# Patient Record
Sex: Female | Born: 1935 | Race: White | Hispanic: No | Smoking: Never smoker
Health system: Southern US, Community
[De-identification: ages and names within clinical notes are randomized; demographics above are authoritative.]

## PROBLEM LIST (undated history)

## (undated) DIAGNOSIS — I10 Essential (primary) hypertension: Secondary | ICD-10-CM

## (undated) DIAGNOSIS — Z66 Do not resuscitate: Secondary | ICD-10-CM

## (undated) DIAGNOSIS — N183 Chronic kidney disease, stage 3 unspecified: Secondary | ICD-10-CM

## (undated) DIAGNOSIS — M810 Age-related osteoporosis without current pathological fracture: Secondary | ICD-10-CM

## (undated) DIAGNOSIS — K219 Gastro-esophageal reflux disease without esophagitis: Secondary | ICD-10-CM

## (undated) DIAGNOSIS — F0390 Unspecified dementia without behavioral disturbance: Secondary | ICD-10-CM

## (undated) DIAGNOSIS — G309 Alzheimer's disease, unspecified: Secondary | ICD-10-CM

## (undated) DIAGNOSIS — F0280 Dementia in other diseases classified elsewhere without behavioral disturbance: Secondary | ICD-10-CM

## (undated) DIAGNOSIS — E785 Hyperlipidemia, unspecified: Secondary | ICD-10-CM

## (undated) HISTORY — PX: HIP FRACTURE SURGERY: SHX118

---

## 2004-06-27 ENCOUNTER — Ambulatory Visit: Payer: Self-pay | Admitting: Ophthalmology

## 2004-07-25 ENCOUNTER — Ambulatory Visit: Payer: Self-pay | Admitting: Ophthalmology

## 2004-08-11 ENCOUNTER — Ambulatory Visit: Payer: Self-pay | Admitting: Family Medicine

## 2005-11-07 ENCOUNTER — Ambulatory Visit: Payer: Self-pay | Admitting: Family Medicine

## 2006-01-15 ENCOUNTER — Ambulatory Visit: Payer: Self-pay | Admitting: Urology

## 2006-11-12 ENCOUNTER — Ambulatory Visit: Payer: Self-pay | Admitting: Family Medicine

## 2007-01-07 ENCOUNTER — Ambulatory Visit: Payer: Self-pay | Admitting: Internal Medicine

## 2007-01-17 ENCOUNTER — Ambulatory Visit: Payer: Self-pay | Admitting: Internal Medicine

## 2007-02-06 ENCOUNTER — Ambulatory Visit: Payer: Self-pay | Admitting: Internal Medicine

## 2007-05-09 ENCOUNTER — Ambulatory Visit: Payer: Self-pay | Admitting: Internal Medicine

## 2007-05-17 ENCOUNTER — Ambulatory Visit: Payer: Self-pay | Admitting: Internal Medicine

## 2007-06-09 ENCOUNTER — Ambulatory Visit: Payer: Self-pay | Admitting: Internal Medicine

## 2007-07-07 ENCOUNTER — Ambulatory Visit: Payer: Self-pay | Admitting: Internal Medicine

## 2007-07-11 ENCOUNTER — Ambulatory Visit: Payer: Self-pay | Admitting: Internal Medicine

## 2007-08-07 ENCOUNTER — Ambulatory Visit: Payer: Self-pay | Admitting: Internal Medicine

## 2007-10-07 ENCOUNTER — Ambulatory Visit: Payer: Self-pay | Admitting: Internal Medicine

## 2008-01-23 ENCOUNTER — Ambulatory Visit: Payer: Self-pay | Admitting: Family Medicine

## 2009-04-23 ENCOUNTER — Ambulatory Visit: Payer: Self-pay | Admitting: Family Medicine

## 2010-04-25 ENCOUNTER — Ambulatory Visit: Payer: Self-pay | Admitting: Family Medicine

## 2010-08-26 ENCOUNTER — Ambulatory Visit: Payer: Self-pay | Admitting: Internal Medicine

## 2011-11-28 ENCOUNTER — Ambulatory Visit: Payer: Self-pay | Admitting: Family Medicine

## 2013-07-14 ENCOUNTER — Ambulatory Visit: Payer: Self-pay | Admitting: Family Medicine

## 2014-04-03 ENCOUNTER — Emergency Department: Payer: Self-pay | Admitting: Emergency Medicine

## 2014-07-31 ENCOUNTER — Inpatient Hospital Stay
Admit: 2014-07-31 | Disposition: A | Discharge status: Skilled Nursing Facility | Payer: Self-pay | Attending: Internal Medicine | Admitting: Internal Medicine

## 2014-07-31 ENCOUNTER — Ambulatory Visit
Admit: 2014-07-31 | Disposition: A | Discharge status: Home / Self Care | Payer: Self-pay | Admitting: Orthopedic Surgery

## 2014-07-31 LAB — COMPREHENSIVE METABOLIC PANEL
ALK PHOS: 91 U/L
ALT: 18 U/L
AST: 28 U/L
Albumin: 3.2 g/dL — ABNORMAL LOW
Anion Gap: 7 (ref 7–16)
BUN: 25 mg/dL — ABNORMAL HIGH
Bilirubin,Total: 0.6 mg/dL
CO2: 26 mmol/L
CREATININE: 1.51 mg/dL — AB
Calcium, Total: 8.8 mg/dL — ABNORMAL LOW
Chloride: 101 mmol/L
EGFR (African American): 37 — ABNORMAL LOW
EGFR (Non-African Amer.): 32 — ABNORMAL LOW
Glucose: 117 mg/dL — ABNORMAL HIGH
Potassium: 3.4 mmol/L — ABNORMAL LOW
Sodium: 134 mmol/L — ABNORMAL LOW
Total Protein: 7.5 g/dL

## 2014-07-31 LAB — CBC
HCT: 36.7 % (ref 35.0–47.0)
HGB: 12.1 g/dL (ref 12.0–16.0)
MCH: 26.7 pg (ref 26.0–34.0)
MCHC: 33 g/dL (ref 32.0–36.0)
MCV: 81 fL (ref 80–100)
PLATELETS: 225 10*3/uL (ref 150–440)
RBC: 4.54 10*6/uL (ref 3.80–5.20)
RDW: 14.9 % — ABNORMAL HIGH (ref 11.5–14.5)
WBC: 6.7 10*3/uL (ref 3.6–11.0)

## 2014-08-01 LAB — CBC WITH DIFFERENTIAL/PLATELET
BASOS ABS: 0 10*3/uL (ref 0.0–0.1)
BASOS PCT: 0.4 %
EOS ABS: 0.1 10*3/uL (ref 0.0–0.7)
Eosinophil %: 0.8 %
HCT: 36.1 % (ref 35.0–47.0)
HGB: 11.6 g/dL — ABNORMAL LOW (ref 12.0–16.0)
LYMPHS PCT: 11.9 %
Lymphocyte #: 0.9 10*3/uL — ABNORMAL LOW (ref 1.0–3.6)
MCH: 26.2 pg (ref 26.0–34.0)
MCHC: 32 g/dL (ref 32.0–36.0)
MCV: 82 fL (ref 80–100)
MONO ABS: 0.6 x10 3/mm (ref 0.2–0.9)
Monocyte %: 8.3 %
NEUTROS ABS: 6.1 10*3/uL (ref 1.4–6.5)
Neutrophil %: 78.6 %
Platelet: 205 10*3/uL (ref 150–440)
RBC: 4.42 10*6/uL (ref 3.80–5.20)
RDW: 15.3 % — AB (ref 11.5–14.5)
WBC: 7.7 10*3/uL (ref 3.6–11.0)

## 2014-08-01 LAB — URINALYSIS, COMPLETE
Bacteria: NONE SEEN
Bilirubin,UR: NEGATIVE
Glucose,UR: NEGATIVE mg/dL (ref 0–75)
Ketone: NEGATIVE
Nitrite: NEGATIVE
PH: 7 (ref 4.5–8.0)
Protein: 100
Specific Gravity: 1.009 (ref 1.003–1.030)
WBC UR: 11 /HPF (ref 0–5)

## 2014-08-01 LAB — APTT: Activated PTT: 26.2 secs (ref 23.6–35.9)

## 2014-08-01 LAB — BASIC METABOLIC PANEL
Anion Gap: 3 — ABNORMAL LOW (ref 7–16)
BUN: 20 mg/dL
CALCIUM: 8.4 mg/dL — AB
CHLORIDE: 104 mmol/L
CO2: 28 mmol/L
Creatinine: 1.46 mg/dL — ABNORMAL HIGH
EGFR (African American): 38 — ABNORMAL LOW
EGFR (Non-African Amer.): 33 — ABNORMAL LOW
Glucose: 109 mg/dL — ABNORMAL HIGH
POTASSIUM: 4.3 mmol/L
Sodium: 135 mmol/L

## 2014-08-01 LAB — PROTIME-INR
INR: 1
PROTHROMBIN TIME: 13.2 s

## 2014-08-02 LAB — CBC WITH DIFFERENTIAL/PLATELET
BASOS ABS: 0 10*3/uL (ref 0.0–0.1)
Basophil %: 0.4 %
Eosinophil #: 0.3 10*3/uL (ref 0.0–0.7)
Eosinophil %: 2.7 %
HCT: 33.2 % — ABNORMAL LOW (ref 35.0–47.0)
HGB: 10.6 g/dL — ABNORMAL LOW (ref 12.0–16.0)
LYMPHS ABS: 0.6 10*3/uL — AB (ref 1.0–3.6)
Lymphocyte %: 5.8 %
MCH: 26.1 pg (ref 26.0–34.0)
MCHC: 32 g/dL (ref 32.0–36.0)
MCV: 82 fL (ref 80–100)
MONOS PCT: 7.4 %
Monocyte #: 0.7 x10 3/mm (ref 0.2–0.9)
NEUTROS PCT: 83.7 %
Neutrophil #: 8 10*3/uL — ABNORMAL HIGH (ref 1.4–6.5)
PLATELETS: 169 10*3/uL (ref 150–440)
RBC: 4.07 10*6/uL (ref 3.80–5.20)
RDW: 15.3 % — ABNORMAL HIGH (ref 11.5–14.5)
WBC: 9.6 10*3/uL (ref 3.6–11.0)

## 2014-08-02 LAB — BASIC METABOLIC PANEL
ANION GAP: 7 (ref 7–16)
BUN: 17 mg/dL
CHLORIDE: 104 mmol/L
CO2: 24 mmol/L
CREATININE: 1.47 mg/dL — AB
Calcium, Total: 8.2 mg/dL — ABNORMAL LOW
GFR CALC AF AMER: 38 — AB
GFR CALC NON AF AMER: 33 — AB
GLUCOSE: 130 mg/dL — AB
POTASSIUM: 4.3 mmol/L
SODIUM: 135 mmol/L

## 2014-08-04 LAB — BASIC METABOLIC PANEL
Anion Gap: 8 (ref 7–16)
BUN: 32 mg/dL — ABNORMAL HIGH
CALCIUM: 8.3 mg/dL — AB
CO2: 25 mmol/L
Chloride: 100 mmol/L — ABNORMAL LOW
Creatinine: 1.55 mg/dL — ABNORMAL HIGH
EGFR (African American): 36 — ABNORMAL LOW
GFR CALC NON AF AMER: 31 — AB
Glucose: 139 mg/dL — ABNORMAL HIGH
POTASSIUM: 4 mmol/L
SODIUM: 133 mmol/L — AB

## 2014-08-04 LAB — CBC WITH DIFFERENTIAL/PLATELET
BASOS PCT: 0.4 %
Basophil #: 0 10*3/uL (ref 0.0–0.1)
Eosinophil #: 0.1 10*3/uL (ref 0.0–0.7)
Eosinophil %: 1.5 %
HCT: 32.4 % — AB (ref 35.0–47.0)
HGB: 10.4 g/dL — AB (ref 12.0–16.0)
LYMPHS ABS: 0.6 10*3/uL — AB (ref 1.0–3.6)
LYMPHS PCT: 7.2 %
MCH: 26.3 pg (ref 26.0–34.0)
MCHC: 32.1 g/dL (ref 32.0–36.0)
MCV: 82 fL (ref 80–100)
Monocyte #: 0.7 x10 3/mm (ref 0.2–0.9)
Monocyte %: 7.9 %
NEUTROS ABS: 7 10*3/uL — AB (ref 1.4–6.5)
Neutrophil %: 83 %
Platelet: 234 10*3/uL (ref 150–440)
RBC: 3.96 10*6/uL (ref 3.80–5.20)
RDW: 15.5 % — ABNORMAL HIGH (ref 11.5–14.5)
WBC: 8.5 10*3/uL (ref 3.6–11.0)

## 2014-08-06 ENCOUNTER — Telehealth: Payer: Self-pay

## 2014-08-06 NOTE — Telephone Encounter (Signed)
PLEASE NOTE: All timestamps contained within this report are represented as [place] Time. CONFIDENTIALTY NOTICE: This fax transmission is intended only for the addressee. It contains information that is legally privileged, confidential or otherwise protected from use or disclosure. If you are not the intended recipient, you are strictly prohibited from reviewing, disclosing, copying using or disseminating any of this information or taking any action in reliance on or regarding this information. If you have received this fax in error, please notify us immediately by telephone so that we can arrange for its return to us. Phone: [phone], Toll-Free: [phone], Fax: [phone] Page: 1 of 1 Call Id: [id number] [facility] Client TELEPHONE ADVICE RECORD [facility] Patient Name: Alicia Cohen Gender: Female DOB: 11/03/1930 Age: [age] Y 2 M 26 D Return Phone Number: Address: City/State/Zip: [place] [phone] Client [facility] Physician Tillman AbideLetvak, Richard Contact Type Call Call Type Page Only Caller Name Ann Relationship To Patient Provider Is this call to report lab results? No Return Phone Number Unavailable Initial Comment Caller states she has a patient admitted with dementia that jumped over the arm-rail and fell and hit her back and has a left femoral neck fracture. Caller is nurse [facility]. UX#[phone] Nurse Assessment Guidelines Guideline Title Affirmed Question Affirmed Notes Nurse Date/Time (Eastern Time) Disp. Time [name](Eastern Time) Disposition Final User 08/05/2014 9:34:34 PM [facility] Paging Queue Dorene SorrowChambers, [name] 08/05/2014 9:42:05 PM Called On-Call Provider [name], [name] 08/05/2014 9:43:15 PM Page Completed Yes [name], [name] After Care Instructions Given Call Event Type User Date / Time  Description Paging DoctorName DoctorPhone DateTime Result/Outcome Notes Marga MelnickHopper, William [phone] 08/05/2014 9:42:05 PM Called On Call Provider - Reached Marga MelnickHopper, William 08/05/2014 9:42:45 PM Spoke with On Call - General Reached on call and connected parties

## 2014-08-06 NOTE — Telephone Encounter (Signed)
Reviewed at [facility]

## 2014-08-07 DIAGNOSIS — S7292XA Unspecified fracture of left femur, initial encounter for closed fracture: Secondary | ICD-10-CM | POA: Diagnosis not present

## 2014-08-07 DIAGNOSIS — R410 Disorientation, unspecified: Secondary | ICD-10-CM

## 2014-08-07 DIAGNOSIS — G301 Alzheimer's disease with late onset: Secondary | ICD-10-CM | POA: Diagnosis not present

## 2014-08-07 DIAGNOSIS — I10 Essential (primary) hypertension: Secondary | ICD-10-CM

## 2014-08-07 DIAGNOSIS — K219 Gastro-esophageal reflux disease without esophagitis: Secondary | ICD-10-CM | POA: Diagnosis not present

## 2014-08-18 DIAGNOSIS — R509 Fever, unspecified: Secondary | ICD-10-CM | POA: Diagnosis not present

## 2014-08-31 LAB — SURGICAL PATHOLOGY

## 2014-09-06 NOTE — Op Note (Signed)
Patient: This 81 year old Female had a surgical procedure performed on 01-Aug-2014.  Post Operative Report:  Pre-Op Diagnosis Displaced L femoral neck fracture   Post-Op Diagnosis Same   Operation L hip hemiarthroplasty   Anesthesia Spinal   Specimen Type None   Findings As Above  displaced L femoral neck fracture   Surgeon Marjie Skiff   EBL: Minimal  883   Complications None   Description of Procedure: INDICATIONS: The patient is an 81 year old female who was brought into [facility] Emergency Room on 07/31/2014 after a slip and fall at her place of residence. She has dementia at baseline. X-rays were obtained in the Emergency Room showing a displaced femoral neck fracture of the right hip. Orthopedics was consulted. The patient was indicated for a Left hip hemiarthroplasty for both pain relief and mobilization and to prevent the complications related to prolonged bedrest. The patient walks without assistive devices at baseline and after a long discussion with the patient and her family, informed consent was obtained from her son, the POA.   DESCRIPTION OF PROCEDURE: On the day of surgery, the patient was met in the preoperative holding area. All preoperative clearances were reviewed. Anesthesia visited with the patient. The nursing staff reviewed the consent, which was verified. The left hip was marked and the patient was taken to the operating room.  The patient was brought into the operating room and initial timeout done to ensure the identity of the patient. After that, anesthesia in the form of spinal anesthesia was administered along with 2 grams of IV Ancef. The patient was then positioned in the right lateral decubitus position with his left hip facing up on a pegboard. All bony prominences were well padded. Preoperative examination showed no skin lesions at the site of surgery. The leg was cleaned with alcohol and then prepped and draped in a standard sterile fashion. A  timeout was performed and a left posterolateral approach to the left hip was done using a 12 cm incision over the posterior aspect of the greater tuberosity using a knife to incise through skin and subcutaneous fat. An elevator was used to elevate the fat off the fascia. A deep knife was used to create a poke hole through the fascia and Metzenbaum scissors were used to incise the fascia distally and proximally. The Charnley retractors were placed with care taken to avoid the sciatic nerve. The bursa was elevated using electrocautery. At this point, a Hohmann retractor was placed over the superior aspect of the fractured femoral neck to retract the gluteus medius thus revealing the piriformis tendon which was tagged with an Ethibond suture and then released with electrocautery along with the short external rotators and the quadratus femoris. Cautery of the quadratus femoris resulted in a small amount of bleeding. As the middle femoral circumflex was cauterized, the joint capsule was revealed with its disruption secondary to the fracture. The capsulotomy was completed in the shape of a T revealing the fractured femoral neck and the head of the femur sitting in the acetabulum. Using a femoral neck cutting guide, the lesser trochanter was used to reference 1 cm femoral neck cut. The set of this cut was marked, a saw was used to excise about 0.5 cm of bone. Then, a corkscrew was used to remove the femoral head. The femoral neck was then exposed using a retractor and a box osteotome was used to reveal the canal. Canal finder was used to identify the path of the femoral canal and  at this point a lateral reamer was gently entered into the canal to lateralize the approach for the prosthesis. A size 1 broach was passed with ease into the canal, then removed with rasping along the way. A size 2 broach was then passed without difficulty, which was used as a rasp on the way out. Then size 3 and 4 broaches were passed in similar  fashion with increasing difficulty. A size 5  broach was passed into the canal with caution with frequent pauses to prevent fracture. Once seated completely, it was felt to be well secured and a trial head prosthesis was placed with a zero neck and a 49m head. The hip was reduced. The length was deemed to be slightly long and the hip tight in extension, though very stable overall. The hip was dislocated gently and a -3 mm neck was trialed with nearly identical by assessing the tibial tubercles with an improvement in the hip extension tightness. The range of motion in the position of sleep and with hip and knee flexion and external rotation was deemed stable. The hip was then dislocated, and it was noticed that the stem did not loosen. The femoral canal was prepared using a bacitracin solution and irrigation, was then dried and cement was mixed. A cement centralizer was trialed and placed and a size 4 cement stopper was placed in the canal with gentle mallet taps. Once the cement was mixed using a cement gun, it was placed and then pressurized, and the size 5 stem was placed with care ensuring anteversion and lateralization to prevent varus. For 16 minutes, the stem was held in place until the cement cured. Once cured, a size 46 head a -3 neck were trialed, length and stability were deemed adequate and a 46 femoral head was placed with a -3 neck. Once the hip was reduced, it was once again assessed with stability and length deemed adequate. The hip was irrigated copiously. Three drill holes were placed in the greater trochanter. Sutures were passed and a capsular  repair and piriformis musculotendinous capsular repair was completed. The fascia was closed with 0 Vicryl. The subcuticular stitch was done with 2-0 Vicryl. The skin was closed with staples. A sterile compressive dressing was placed and a hip abduction pillow was placed. The patient was positioned in the supine position. The sponge and needle counts were  correct at the end of the case. The patient was transferred to the PACU in stable condition. One x-ray was obtained showing the prosthesis in good position with near equivalent leg lengths.   Electronic Signatures: KMarjie SkiffD (MD)  (Signed 26-Mar-16 23:02)  Authored: Patient and Date/Time, Operative Note   Last Updated: 26-Mar-16 23:02 by KPeterson Lombard(MD)

## 2014-09-06 NOTE — Discharge Summary (Signed)
Dates of Admission and Diagnosis:  Date of Admission 31-Jul-2014   Date of Discharge 05-Aug-2014   Admitting Diagnosis Left femoral neck fracture   Final Diagnosis 1. Left femoral neck fracture 2. Acute blood loss anemia  3. Acute respiratory failure 4. SInus tachycardia 5. Post-op atelectasis 6. Dementia 7. Inpatient delirium    Chief Complaint/History of Present Illness PRIMARY CARE PHYSICIAN: Rhona LeavensJames F. Burnett ShengHedrick, MD   CHIEF COMPLAINT: Leg pain.   HISTORY OF PRESENT ILLNESS: An 81 year old Caucasian female with past medical history of Alzheimer dementia as well as essential hypertension, presenting with leg pain. History is aided by family present at bedside. The patient describes having a mechanical fall, suffering pain over the left lateral portion of her hip. Described only as ???pain.??? Intensity 7/10. Currently she is pain-free; however, it was nonradiating, worse with movement, no relieving factors. During initial workup she was found to have a left femoral neck fracture.   Allergies:  ASA: GI Distress  Morphine: Rash, Itching  Pertinent Past History:  Pertinent Past History Alzheimer dementia, essential hypertension, gastroesophageal reflux disease without esophagitis.   Hospital Course:  Hospital Course 81 year old Caucasian female with a history of Alzheimer dementia as well as essential hypertension presenting with leg pain, found to have left neck fracture.   * Sinus tachycardia Improved with metoprolol  * Acute respiratory failure - resolved Likely from atelectais. Check CXR. No Pneumonia  * s/p left femoral neck fracture  POD # 4 Mild Acute blood loss anemia . No transfusion needed.  * Hypokalemia. Replaced potassium to goal of 4-5.   * Hypertension, essential. On metoprolol  * Inpatient delirium over dementia  Time spent on discharge 40 minutes   Condition on Discharge Fair   PHYSICAL EXAM ON DISCHARGE:  Physical Exam:  GEN no acute distress    HEENT pale conjunctivae   NECK supple   RESP clear BS   CARD regular rate   EXTR negative edema   PSYCH poor insight   VITAL SIGNS:  Vital Signs: **Vital Signs.:   30-Mar-16 07:29  Vital Signs Type Routine  Temperature Temperature (F) 97.8  Celsius 36.5  Temperature Source oral  Pulse Pulse 106  Respirations Respirations 18  Systolic BP Systolic BP 141  Diastolic BP (mmHg) Diastolic BP (mmHg) 81  Mean BP 101  Pulse Ox % Pulse Ox % 93  Pulse Ox Activity Level  At rest  Oxygen Delivery Room Air/ 21 %   DISCHARGE INSTRUCTIONS HOME MEDS:  Medication Reconciliation: Patient's Home Medications at Discharge:     Medication Instructions  omeprazole 20 mg oral delayed release capsule  1 cap(s) orally once a day (in the morning)   exelon 4.6 mg/24 hr transdermal film, extended release  2 patch transdermal once a day (in the morning)   multivitamin  1 tab(s) orally once a day (in the morning)   enoxaparin 30 mg/0.3 ml injectable solution  1  injectable  for 14 days then d/c and begin taking one 81 mg EC ASA per day unless contraindicated   oxycodone 5 mg oral tablet  1-2 tab(s) orally every 4 hours, As needed for severe pain   tramadol 50 mg oral tablet  1-2 tab(s) orally every 4 hours, As needed, pain for mild to moderate pain   acetaminophen 500 mg oral tablet  1 tab(s) orally every 4 hours, As needed, mild pain (1-3/10) or temp. greater than 100.4   magnesium hydroxide 8% oral suspension  30 milliliter(s) orally 2 times a day, As  needed, constipation   bisacodyl 10 mg rectal suppository  1 suppository(ies) rectal once a day, As needed, constipation   docusate-senna 50 mg-8.6 mg oral tablet  1 tab(s) orally 2 times a day   metoprolol tartrate 25 mg oral tablet  1 tab(s) orally 2 times a day    STOP TAKING THE FOLLOWING MEDICATION(S):    lisinopril 5 mg oral tablet: 1 tab(s) orally once a day  Physician's Instructions:  Dressing Care Per ortho instructions   Diet Low  Sodium   Activity Limitations As tolerated  Per ortho instructions   Return to Work Not Applicable   Time frame for Follow Up Appointment 1-2 weeks  Orthopedics   Electronic Signatures: Jonathan Kirkendoll, Andreas Blower (MD)  (Signed 30-Mar-16 11:05)  Authored: ADMISSION DATE AND DIAGNOSIS, CHIEF COMPLAINT/HPI, Allergies, PERTINENT PAST HISTORY, HOSPITAL COURSE, PHYSICAL EXAM ON DISCHARGE, VITAL SIGNS, DISCHARGE INSTRUCTIONS HOME MEDS, PATIENT INSTRUCTIONS   Last Updated: 30-Mar-16 11:05 by Minette Headland (MD)

## 2014-09-06 NOTE — Consult Note (Signed)
Brief Consult Note: Comments: L hip hemiarthroplasty POD1  No acute events overnight. Patient denies complaints, pain well controlled. Her friend in the room states she did very well overnight. She denies CP/SOB.   PE: VS reviewed LLE - dressing c/d/i Motor 5/5 EHL/FHL/TA/GS SILT DP/SP/T BCR, DP 2+  Impression: s/p L hip hemiarthroplasty -WBAT with PT, hip precautions, abduction pillow -IV ancef x 24 hrs -Labs ordered to check H/H and creatinine -OOBTC -Incentive spirometry  -DVT chemoprophylaxis for 2 weeks postop -Pain control - limit narcotics  -DC Foley today if OOB -DC planning - will need SNF placement.  Electronic Signatures: Deatra JamesKahn, Evie Crumpler D (MD)  (Signed 27-Mar-16 08:57)  Authored: Brief Consult Note   Last Updated: 27-Mar-16 08:57 by Deatra JamesKahn, Halsey Hammen D (MD)

## 2014-09-06 NOTE — H&P (Signed)
PATIENT NAME:  Alicia Cohen, Alicia Cohen MR#:  [id number] DATE OF BIRTH:  02-28-1931  DATE OF ADMISSION:  07/31/2014  REFERRING PHYSICIAN: Cecille Amsterdam. Mayford Knife, MD   PRIMARY CARE PHYSICIAN: Rhona Leavens. Burnett Sheng, MD   CHIEF COMPLAINT: Leg pain.   HISTORY OF PRESENT ILLNESS: An 81 year old Caucasian female with past medical history of Alzheimer dementia as well as essential hypertension, presenting with leg pain. History is aided by family present at bedside. The patient describes having a mechanical fall, suffering pain over the left lateral portion of her hip. Described only as "pain." Intensity 7/10. Currently she is pain-free; however, it was nonradiating, worse with movement, no relieving factors. During initial workup she was found to have a left femoral neck fracture.   REVIEW OF SYSTEMS: Question the validity of some of these statements, however:  CONSTITUTIONAL: Denies fevers, chills, fatigue, weakness.  EYES: Denies blurred vision, double vision, or eye pain.  EARS, NOSE, THROAT: Denies tinnitus, ear pain, hearing loss. RESPIRATORY: Denies cough, wheeze, or shortness of breath.  CARDIOVASCULAR: Denies chest pain, palpitations, edema.  GASTROINTESTINAL: Denies nausea, vomiting, diarrhea, or abdominal pain.  GENITOURINARY: Denies dysuria or hematuria. ENDOCRINE: Denies nocturia or thyroid problems.  HEMATOLOGIC AND LYMPHATIC: Denies easy bruising or bleeding.  SKIN: Denies rash or lesions.  MUSCULOSKELETAL: Positive for pain in the left hip, as described above. Otherwise. denies pain in neck, back, shoulder, knees, or further arthritic symptoms.  NEUROLOGIC: Denies paralysis or paresthesias.  PSYCHIATRIC: Denies anxiety or depressive symptoms. Otherwise, a full review of systems performed by me is negative.   PAST MEDICAL HISTORY: Includes Alzheimer dementia, essential hypertension, gastroesophageal reflux disease without esophagitis.   SOCIAL HISTORY: No alcohol, tobacco, or drug usage.  Ambulatory without assistive devices.   FAMILY HISTORY: Denies any known cardiovascular or pulmonary disorders.   ALLERGIES: TO ASPIRIN.   HOME MEDICATIONS: Lisinopril 1 tablet daily of unknown dosage; Exelon patch 4.6 mg per 24 hours daily; Prilosec 20 mg daily; multivitamin 1 tablet p.o. daily.   PHYSICAL EXAMINATION:  VITAL SIGNS: Temperature of 98.8, heart rate of 115, respirations 18, blood pressure 162/68, saturating 96% on room air. Weight 50.3 kg, BMI of 20.3.  GENERAL: A weak, frail-appearing Caucasian female currently in no acute distress.  HEAD: Normocephalic, atraumatic.  EYES: Pupils equal, round, reactive to light. Extraocular muscles intact. No scleral icterus.  MOUTH: Moist mucous membranes. Dentition intact. No abscess noted.  EAR, NOSE, AND THROAT: Clear without exudates. No external lesions.  NECK: Supple. No thyromegaly. No nodules. No JVD.  PULMONARY: Clear to auscultation bilaterally without wheezes, rales, rhonchi. No use of accessory muscles. Good respiratory effort. CHEST: Nontender to palpation.  CARDIOVASCULAR: S1, S2, regular rate and rhythm. No murmurs, rubs, or gallops. No edema. Pedal pulses 2+ bilaterally.  GASTROINTESTINAL: Soft, nontender, nondistended. No masses. Positive bowel sounds. No hepatosplenomegaly.  MUSCULOSKELETAL: No swelling, clubbing, or edema. Range of motion limited in left lower extremity given fracture. Currently shortened, internally rotated.  NEUROLOGIC: Cranial nerves II-XII intact. No gross focal neurological deficits. Sensation intact. Reflexes intact. SKIN: No ulcerations, lesions, rash, or cyanosis. Skin warm, dry. Turgor intact.  PSYCHIATRIC: Mood and affect blunted. She is awake, alert, oriented x 3. Insight and judgment appear to be intact at this time.   LABORATORY DATA: Sodium of 134, potassium 3.4, chloride 101, bicarbonate 26, BUN 25, creatinine 1.51, glucose 117. Albumin 3.2; otherwise, LFTs within normal limits. WBC of 6.7,  hemoglobin 12.1, platelets of 225,000. Chest x-ray performed, which reveals no acute cardiopulmonary process. There is  mention of a focal nodular area in the perihilar aspect of the right lung. X-ray the left hip reveals left femoral neck fracture.   ASSESSMENT AND PLAN: An 81 year old Caucasian female with a history of Alzheimer dementia as well as essential hypertension presenting with leg pain, found to have left neck fracture.  1.  Preoperative evaluation for left femoral neck fracture METs would be considered at less than 4 given generalized dismobility. No further testing or intervention is required prior to surgery. She has no active cardiac symptoms including angina, severe valvular dysfunction, significant arrhythmias, signs of congestive heart failure. As far as medications are concerned, we will hold lisinopril perioperatively. It may be restarted postoperatively.  2.  Acute kidney injury. Intravenous fluid hydration. Follow urine output and renal function. Hold ACE inhibitor, as stated above.  3.  Hypokalemia. Replace potassium to goal of 4-5.  4.  Hypertension, essential. Hold ACE, as stated above. We will add hydralazine p.r.n.  5.  Venous thromboembolism prophylaxis. Sequential compression devices   CODE STATUS: The patient is full code.   TIME SPENT: 45 minutes.    ____________________________ Cletis Athensavid K. Hower, MD dkh:bm D: 08/01/2014 00:50:21 ET T: 08/01/2014 01:06:10 ET JOB#: [id number]  cc: Cletis Athensavid K. Hower, MD, <Dictator> DAVID Synetta ShadowK HOWER MD ELECTRONICALLY SIGNED 08/01/2014 20:40

## 2014-09-06 NOTE — Consult Note (Signed)
Brief Consult Note: Diagnosis: L femoral neck fracture.   Patient was seen by consultant.   Recommend to proceed with surgery or procedure.   Comments: Comments: HPI:  [age] F with PMH alzheimers, family friend present who provided history of fall today. Fall was witnessed, no head trauma, no LOC. No history of frequent falls. She was unable to ambulate since. Brought to the ED by ambulance, where an Xray was obtained that revealed a displaced L hip femoral neck fracture. She lives at home with her husband, has 3 adult children of which the son is the POA Horald Pollen(Robert Tukes [phone]), and ambulates without assistive devices at baseline.   PMH: Alzheimers All: ASA  PE:  LLE - Short, ER Unable to SLR pain with log roll DP2+ PT 2+ No calf pain, compartments soft 5/5 EHL/FHL/TA/GS SILT DP/SP/T BCR  Xray - L hip with displaced femoral neck fracture  Impression: L hip displaced femoral neck fracture  Plan: L hip hemiarthroplasty -NPO after midnight -Hold DVT chemoprophylaxis -Preop labs - needs UA, coags  -Foley catheter -Medical clearance -Bed rest -Pain control -informed consent was obtained, I had a long discussion with her son regarding the risks, benefits and alternatives to hemiarthroplasty of her left hip.He verbalized understanding, all questions were answered.  Electronic Signatures: Deatra JamesKahn, Shawntee Mainwaring D (MD)  (Signed 26-Mar-16 00:29)  Authored: Brief Consult Note   Last Updated: 26-Mar-16 00:29 by Deatra JamesKahn, Breane Grunwald D (MD)

## 2014-09-06 NOTE — Consult Note (Signed)
Brief Consult Note: Diagnosis: L femoral neck fracture.   Comments: Postop check L hip hemiarthroplasty  Patient reports doing well, says her son visited her. Denies pain. Friend in room states she is doing well. She denies CP/SOB.   PE: VS reviewed LLE - dressing c/d/i Motor 5/5 EHL/FHL/TA/GS SILT DP/SP/T BCR, DP 2+  Impression: s/p L hip hemiarthroplasty -postop xray reviewed, hardware well aligned -WBAT with PT, hip precautions, abduction pillow -IV ancef x 24 hrs -OOBTC -Incentive spirometry  -DVT chemoprophylaxis for 2 weeks postop -Pain control - limit narcotics  -DC Foley Sunday once OOB -DC planning - will need SNF placement.  Electronic Signatures: Andee PolesKahn, Kasiah Manka D (MD)  (Signed 26-Mar-16 22:27)  Authored: Brief Consult Note   Last Updated: 26-Mar-16 22:27 by Deatra JamesKahn, Shemiah Rosch D (MD)

## 2014-09-06 NOTE — Consult Note (Signed)
Brief Consult Note: Diagnosis: L femoral neck fracture.   Comments: Patient denies complaints, nursing reports no events overnight. Family friend in room states patient did very well over night. Patient pleasant this am  NAD, AAOx1 vitals reviewed LLE - shortened, ER pain with log roll 5/5 EHL/FHL/TA/gS SILT DP/SP/T BCR  Impression: L hip femoral neck fracture Plan For OR today Medical notes reviewed AM labs reviewed Marked for OR Continue NPO, hold chemoprophylaxis Ancef ordered and held for OR.  Electronic Signatures: Andee PolesKahn, Caroleena Paolini D (MD)  (Signed 26-Mar-16 08:25)  Authored: Brief Consult Note   Last Updated: 26-Mar-16 08:25 by Deatra JamesKahn, Maycol Hoying D (MD)

## 2014-10-01 ENCOUNTER — Other Ambulatory Visit: Payer: Self-pay | Admitting: Internal Medicine

## 2014-11-05 ENCOUNTER — Encounter: Payer: Self-pay | Admitting: *Deleted

## 2014-11-05 ENCOUNTER — Emergency Department
Admission: EM | Admit: 2014-11-05 | Discharge: 2014-11-05 | Discharge status: Against Medical Advice | Payer: Medicare Other | Attending: Emergency Medicine | Admitting: Emergency Medicine

## 2014-11-05 DIAGNOSIS — Y9289 Other specified places as the place of occurrence of the external cause: Secondary | ICD-10-CM | POA: Insufficient documentation

## 2014-11-05 DIAGNOSIS — W1839XA Other fall on same level, initial encounter: Secondary | ICD-10-CM | POA: Insufficient documentation

## 2014-11-05 DIAGNOSIS — Y9389 Activity, other specified: Secondary | ICD-10-CM | POA: Insufficient documentation

## 2014-11-05 DIAGNOSIS — Y998 Other external cause status: Secondary | ICD-10-CM | POA: Diagnosis not present

## 2014-11-05 DIAGNOSIS — W19XXXA Unspecified fall, initial encounter: Secondary | ICD-10-CM

## 2014-11-05 DIAGNOSIS — F0390 Unspecified dementia without behavioral disturbance: Secondary | ICD-10-CM | POA: Insufficient documentation

## 2014-11-05 DIAGNOSIS — Z043 Encounter for examination and observation following other accident: Secondary | ICD-10-CM | POA: Insufficient documentation

## 2014-11-05 NOTE — ED Provider Notes (Signed)
[facility] Emergency Department Provider Note    ____________________________________________  Time seen: 1815  I have reviewed the triage vital signs and the nursing notes.   HISTORY  Chief Complaint Fall   History limited by: Dementia   HPI Alicia Cohen is a 81 y.o. female who presents to the emergency department today because of a fall. This occurred at 4:30 this morning. She went outside and then was found down between 2 cars. She does not recall how she fell. She does not think she hit her head. She is currently not complaining of any pain. Per caregiver she is acting at her baseline. EMS was called out initially and did not see any initial acute injuries. Patient is not on any blood thinners.   History reviewed. No pertinent past medical history.  There are no active problems to display for this patient.   Past Surgical History  Procedure Laterality Date  . Hip fracture surgery      No current outpatient prescriptions on file.  Allergies Review of patient's allergies indicates no known allergies.  No family history on file.  Social History History  Substance Use Topics  . Smoking status: Never Smoker   . Smokeless tobacco: Not on file  . Alcohol Use: No    Review of Systems  Constitutional: Negative for fever. Cardiovascular: Negative for chest pain. Respiratory: Negative for shortness of breath. Gastrointestinal: Negative for abdominal pain, vomiting and diarrhea. Musculoskeletal: Negative for back pain. Skin: Negative for rash. Neurological: Negative for headaches, focal weakness or numbness.   10-point ROS otherwise negative.  ____________________________________________   PHYSICAL EXAM:  VITAL SIGNS: ED Triage Vitals  Enc Vitals Group     BP 11/05/14 1700 146/77 mmHg     Pulse Rate 11/05/14 1700 114     Resp --      Temp 11/05/14 1700 97.6 F (36.4 C)     Temp Source 11/05/14 1700 Oral     SpO2 11/05/14  1700 92 %     Weight 11/05/14 1700 100 lb (45.36 kg)     Height 11/05/14 1700 5' (1.524 m)   Constitutional: Alert and oriented. Well appearing and in no distress. Eyes: Conjunctivae are normal. PERRL. Normal extraocular movements. ENT   Head: Normocephalic and atraumatic.   Nose: No congestion/rhinnorhea.   Mouth/Throat: Mucous membranes are moist.   Neck: No stridor. Hematological/Lymphatic/Immunilogical: No cervical lymphadenopathy. Cardiovascular: Normal rate, regular rhythm.  No murmurs, rubs, or gallops. Respiratory: Normal respiratory effort without tachypnea nor retractions. Breath sounds are clear and equal bilaterally. No wheezes/rales/rhonchi. Gastrointestinal: Soft and nontender. No distention. There is no CVA tenderness. Genitourinary: Deferred Musculoskeletal: Normal range of motion in all extremities. No joint effusions.  No lower extremity tenderness nor edema. Neurologic:  Normal speech and language. No gross focal neurologic deficits are appreciated. Speech is normal.  Skin:  Skin is warm, dry and intact. No rash noted. Psychiatric: Mood and affect are normal. Speech and behavior are normal. Patient exhibits appropriate insight and judgment.  ____________________________________________    LABS (pertinent positives/negatives)  None  ____________________________________________   EKG  None  ____________________________________________    RADIOLOGY  None  ____________________________________________   PROCEDURES  Procedure(s) performed: None  Critical Care performed: No  ____________________________________________   INITIAL IMPRESSION / ASSESSMENT AND PLAN / ED COURSE  Pertinent labs & imaging results that were available during my care of the patient were reviewed by me and considered in my medical decision making (see chart for details).  Patient  presents to the emergency department for evaluation after a fall. No obvious  traumatic injuries. However given the patient does have dementia and is not quite clear how the fall occurred I will get a CT of the head to rule out an intracranial bleed.  ----------------------------------------- 7:52 PM on 11/05/2014 -----------------------------------------  Appears that the patient and the caregiver left prior to the head CT being performed. They did not alert staff for me plans to leave [place] no discharge paperwork was able to be prepared.  ____________________________________________   FINAL CLINICAL IMPRESSION(S) / ED DIAGNOSES  Final diagnoses:  Fall, initial encounter   dementia   Phineas Semen, MD 11/05/14 1952

## 2014-11-05 NOTE — ED Notes (Signed)
Pt fell today , EMS examined pt on seen this morning, son is requesting pt to be checked,pt denies hitting head, pt denies any pain

## 2014-11-05 NOTE — ED Notes (Signed)
Pt here with care giver. Pt was brought in today to be checked out because she had a fall last night. Pt wondered out into the parking lot st unknown time and fell. Pt was noticed to be missing and was found around 4:30 laying between 2 cars. EMS was called to check pt out and no injuries were found. Pt's son wanted her evaluated and pt was brought in by daytime care giver. Pt stays alone at everyday from 1pm until 9am.  Care giver states that pt needs higher level of care and she should not bee living alone.  Pt has not complaints at this time.  Pt is wearing a cast on her left arm from previous fall. Pt in NAD at this time will cont to monitor pt at all times.

## 2014-11-05 NOTE — ED Notes (Signed)
Pt still not in the room, provider notified and pt was removed from the computer

## 2014-11-05 NOTE — ED Notes (Signed)
CT tech into pts room to take her for CT, pt nor her caregiver are in the room.  Will wait a few mins and check room again if pt is still not there will notify provider and d/c pt.

## 2014-11-25 ENCOUNTER — Emergency Department: Payer: Medicare Other

## 2014-11-25 ENCOUNTER — Inpatient Hospital Stay
Admission: EM | Admit: 2014-11-25 | Discharge: 2014-11-30 | DRG: 308 | Disposition: A | Discharge status: Home Health | Payer: Medicare Other | Attending: Internal Medicine | Admitting: Internal Medicine

## 2014-11-25 ENCOUNTER — Encounter: Payer: Self-pay | Admitting: Emergency Medicine

## 2014-11-25 DIAGNOSIS — I129 Hypertensive chronic kidney disease with stage 1 through stage 4 chronic kidney disease, or unspecified chronic kidney disease: Secondary | ICD-10-CM | POA: Diagnosis not present

## 2014-11-25 DIAGNOSIS — J189 Pneumonia, unspecified organism: Secondary | ICD-10-CM | POA: Diagnosis not present

## 2014-11-25 DIAGNOSIS — K219 Gastro-esophageal reflux disease without esophagitis: Secondary | ICD-10-CM | POA: Diagnosis present

## 2014-11-25 DIAGNOSIS — N184 Chronic kidney disease, stage 4 (severe): Secondary | ICD-10-CM | POA: Diagnosis not present

## 2014-11-25 DIAGNOSIS — E782 Mixed hyperlipidemia: Secondary | ICD-10-CM | POA: Diagnosis not present

## 2014-11-25 DIAGNOSIS — I471 Supraventricular tachycardia: Secondary | ICD-10-CM | POA: Diagnosis present

## 2014-11-25 DIAGNOSIS — Y939 Activity, unspecified: Secondary | ICD-10-CM | POA: Diagnosis not present

## 2014-11-25 DIAGNOSIS — I4891 Unspecified atrial fibrillation: Secondary | ICD-10-CM | POA: Diagnosis present

## 2014-11-25 DIAGNOSIS — W19XXXA Unspecified fall, initial encounter: Secondary | ICD-10-CM | POA: Diagnosis not present

## 2014-11-25 DIAGNOSIS — E46 Unspecified protein-calorie malnutrition: Secondary | ICD-10-CM | POA: Diagnosis present

## 2014-11-25 DIAGNOSIS — F0280 Dementia in other diseases classified elsewhere without behavioral disturbance: Secondary | ICD-10-CM | POA: Diagnosis present

## 2014-11-25 DIAGNOSIS — G309 Alzheimer's disease, unspecified: Secondary | ICD-10-CM | POA: Diagnosis present

## 2014-11-25 DIAGNOSIS — I214 Non-ST elevation (NSTEMI) myocardial infarction: Secondary | ICD-10-CM | POA: Diagnosis not present

## 2014-11-25 DIAGNOSIS — Z9181 History of falling: Secondary | ICD-10-CM | POA: Diagnosis not present

## 2014-11-25 DIAGNOSIS — R0902 Hypoxemia: Secondary | ICD-10-CM | POA: Diagnosis present

## 2014-11-25 DIAGNOSIS — S062X0A Diffuse traumatic brain injury without loss of consciousness, initial encounter: Secondary | ICD-10-CM | POA: Diagnosis not present

## 2014-11-25 DIAGNOSIS — G9389 Other specified disorders of brain: Secondary | ICD-10-CM

## 2014-11-25 DIAGNOSIS — M810 Age-related osteoporosis without current pathological fracture: Secondary | ICD-10-CM | POA: Diagnosis present

## 2014-11-25 DIAGNOSIS — R509 Fever, unspecified: Secondary | ICD-10-CM

## 2014-11-25 DIAGNOSIS — Y92129 Unspecified place in nursing home as the place of occurrence of the external cause: Secondary | ICD-10-CM

## 2014-11-25 DIAGNOSIS — S0011XA Contusion of right eyelid and periocular area, initial encounter: Secondary | ICD-10-CM | POA: Diagnosis present

## 2014-11-25 HISTORY — DX: Gastro-esophageal reflux disease without esophagitis: K21.9

## 2014-11-25 HISTORY — DX: Age-related osteoporosis without current pathological fracture: M81.0

## 2014-11-25 HISTORY — DX: Unspecified dementia, unspecified severity, without behavioral disturbance, psychotic disturbance, mood disturbance, and anxiety: F03.90

## 2014-11-25 HISTORY — DX: Hyperlipidemia, unspecified: E78.5

## 2014-11-25 HISTORY — DX: Essential (primary) hypertension: I10

## 2014-11-25 HISTORY — DX: Do not resuscitate: Z66

## 2014-11-25 HISTORY — DX: Alzheimer's disease, unspecified: G30.9

## 2014-11-25 HISTORY — DX: Chronic kidney disease, stage 3 unspecified: N18.30

## 2014-11-25 HISTORY — DX: Chronic kidney disease, stage 3 (moderate): N18.3

## 2014-11-25 HISTORY — DX: Dementia in other diseases classified elsewhere, unspecified severity, without behavioral disturbance, psychotic disturbance, mood disturbance, and anxiety: F02.80

## 2014-11-25 LAB — URINALYSIS COMPLETE WITH MICROSCOPIC (ARMC ONLY)
Bilirubin Urine: NEGATIVE
GLUCOSE, UA: NEGATIVE mg/dL
Ketones, ur: NEGATIVE mg/dL
LEUKOCYTES UA: NEGATIVE
NITRITE: NEGATIVE
Protein, ur: >500 mg/dL — AB
SPECIFIC GRAVITY, URINE: 1.013 (ref 1.005–1.030)
pH: 6 (ref 5.0–8.0)

## 2014-11-25 LAB — COMPREHENSIVE METABOLIC PANEL
ALK PHOS: 115 U/L (ref 38–126)
ALT: 21 U/L (ref 14–54)
ANION GAP: 10 (ref 5–15)
AST: 27 U/L (ref 15–41)
Albumin: 2.7 g/dL — ABNORMAL LOW (ref 3.5–5.0)
BUN: 30 mg/dL — ABNORMAL HIGH (ref 6–20)
CHLORIDE: 96 mmol/L — AB (ref 101–111)
CO2: 27 mmol/L (ref 22–32)
Calcium: 8.7 mg/dL — ABNORMAL LOW (ref 8.9–10.3)
Creatinine, Ser: 1.51 mg/dL — ABNORMAL HIGH (ref 0.44–1.00)
GFR calc Af Amer: 36 mL/min — ABNORMAL LOW (ref >60)
GFR, EST NON AFRICAN AMERICAN: 31 mL/min — AB (ref >60)
GLUCOSE: 120 mg/dL — AB (ref 65–99)
Potassium: 3.7 mmol/L (ref 3.5–5.1)
SODIUM: 133 mmol/L — AB (ref 135–145)
Total Bilirubin: 0.4 mg/dL (ref 0.3–1.2)
Total Protein: 7 g/dL (ref 6.5–8.1)

## 2014-11-25 LAB — MRSA PCR SCREENING: MRSA by PCR: NEGATIVE

## 2014-11-25 LAB — TROPONIN I
Troponin I: 0.14 ng/mL — ABNORMAL HIGH (ref <0.031)
Troponin I: 0.03 ng/mL (ref <0.031)

## 2014-11-25 LAB — CBC
HCT: 36.9 % (ref 35.0–47.0)
HEMOGLOBIN: 12 g/dL (ref 12.0–16.0)
MCH: 27.1 pg (ref 26.0–34.0)
MCHC: 32.7 g/dL (ref 32.0–36.0)
MCV: 82.9 fL (ref 80.0–100.0)
Platelets: 190 10*3/uL (ref 150–440)
RBC: 4.44 MIL/uL (ref 3.80–5.20)
RDW: 16.7 % — AB (ref 11.5–14.5)
WBC: 16.3 10*3/uL — ABNORMAL HIGH (ref 3.6–11.0)

## 2014-11-25 LAB — CK: Total CK: 33 U/L — ABNORMAL LOW (ref 38–234)

## 2014-11-25 LAB — TSH: TSH: 2.047 u[IU]/mL (ref 0.350–4.500)

## 2014-11-25 MED ORDER — DILTIAZEM HCL ER COATED BEADS 240 MG PO CP24
240.0000 mg | ORAL_CAPSULE | Freq: Once | ORAL | Status: AC
  Administered 2014-11-25: 240 mg via ORAL
  Filled 2014-11-25: qty 1

## 2014-11-25 MED ORDER — LORAZEPAM 2 MG/ML IJ SOLN
1.0000 mg | Freq: Once | INTRAMUSCULAR | Status: AC
  Administered 2014-11-26: 1 mg via INTRAVENOUS
  Filled 2014-11-25: qty 1

## 2014-11-25 MED ORDER — DILTIAZEM HCL 25 MG/5ML IV SOLN
5.0000 mg | Freq: Once | INTRAVENOUS | Status: AC
  Administered 2014-11-25: 5 mg via INTRAVENOUS
  Filled 2014-11-25: qty 5

## 2014-11-25 MED ORDER — CETYLPYRIDINIUM CHLORIDE 0.05 % MT LIQD
7.0000 mL | Freq: Two times a day (BID) | OROMUCOSAL | Status: DC
  Administered 2014-11-25 – 2014-11-30 (×10): 7 mL via OROMUCOSAL

## 2014-11-25 MED ORDER — ACETAMINOPHEN 650 MG RE SUPP: 650.0000 mg | Freq: Four times a day (QID) | RECTAL | Status: DC | PRN

## 2014-11-25 MED ORDER — ONDANSETRON HCL 4 MG PO TABS: 4.0000 mg | ORAL_TABLET | Freq: Four times a day (QID) | ORAL | Status: DC | PRN

## 2014-11-25 MED ORDER — MORPHINE SULFATE 2 MG/ML IJ SOLN: 1.0000 mg | INTRAMUSCULAR | Status: DC | PRN

## 2014-11-25 MED ORDER — ACETAMINOPHEN 325 MG PO TABS
650.0000 mg | ORAL_TABLET | Freq: Four times a day (QID) | ORAL | Status: DC | PRN
  Administered 2014-11-26: 650 mg via ORAL
  Filled 2014-11-25: qty 2

## 2014-11-25 MED ORDER — METOPROLOL TARTRATE 50 MG PO TABS
50.0000 mg | ORAL_TABLET | Freq: Two times a day (BID) | ORAL | Status: DC
  Administered 2014-11-25 – 2014-11-30 (×10): 50 mg via ORAL
  Filled 2014-11-25 (×11): qty 1

## 2014-11-25 MED ORDER — DILTIAZEM HCL 100 MG IV SOLR
5.0000 mg/h | INTRAVENOUS | Status: DC
  Administered 2014-11-26: 15 mg/h via INTRAVENOUS
  Filled 2014-11-25 (×2): qty 100

## 2014-11-25 MED ORDER — LISINOPRIL 5 MG PO TABS: 5.0000 mg | ORAL_TABLET | Freq: Every day | ORAL | Status: DC

## 2014-11-25 MED ORDER — RIVASTIGMINE 9.5 MG/24HR TD PT24
9.5000 mg | MEDICATED_PATCH | Freq: Every day | TRANSDERMAL | Status: DC
  Administered 2014-11-25 – 2014-11-29 (×5): 9.5 mg via TRANSDERMAL
  Filled 2014-11-25 (×6): qty 1

## 2014-11-25 MED ORDER — SODIUM CHLORIDE 0.9 % IV SOLN
INTRAVENOUS | Status: DC
  Administered 2014-11-25 – 2014-11-27 (×3): via INTRAVENOUS

## 2014-11-25 MED ORDER — PANTOPRAZOLE SODIUM 40 MG PO TBEC
40.0000 mg | DELAYED_RELEASE_TABLET | Freq: Every day | ORAL | Status: DC
  Administered 2014-11-25 – 2014-11-30 (×6): 40 mg via ORAL
  Filled 2014-11-25 (×6): qty 1

## 2014-11-25 MED ORDER — DOCUSATE SODIUM 100 MG PO CAPS
100.0000 mg | ORAL_CAPSULE | Freq: Two times a day (BID) | ORAL | Status: DC
  Administered 2014-11-25 – 2014-11-30 (×8): 100 mg via ORAL
  Filled 2014-11-25 (×8): qty 1

## 2014-11-25 MED ORDER — METOPROLOL TARTRATE 25 MG PO TABS
25.0000 mg | ORAL_TABLET | Freq: Two times a day (BID) | ORAL | Status: DC
  Administered 2014-11-25: 25 mg via ORAL
  Filled 2014-11-25: qty 1

## 2014-11-25 MED ORDER — LORATADINE 10 MG PO TABS
10.0000 mg | ORAL_TABLET | Freq: Every day | ORAL | Status: DC
  Administered 2014-11-25 – 2014-11-30 (×6): 10 mg via ORAL
  Filled 2014-11-25 (×6): qty 1

## 2014-11-25 MED ORDER — HEPARIN SODIUM (PORCINE) 5000 UNIT/ML IJ SOLN
5000.0000 [IU] | Freq: Three times a day (TID) | INTRAMUSCULAR | Status: DC
  Administered 2014-11-25 (×2): 5000 [IU] via SUBCUTANEOUS
  Filled 2014-11-25 (×2): qty 1

## 2014-11-25 MED ORDER — ONDANSETRON HCL 4 MG/2ML IJ SOLN: 4.0000 mg | Freq: Four times a day (QID) | INTRAMUSCULAR | Status: DC | PRN

## 2014-11-25 MED ORDER — ADULT MULTIVITAMIN W/MINERALS CH
1.0000 | ORAL_TABLET | Freq: Every day | ORAL | Status: DC
  Administered 2014-11-26 – 2014-11-30 (×5): 1 via ORAL
  Filled 2014-11-25 (×5): qty 1

## 2014-11-25 NOTE — ED Notes (Signed)
Pt resting quietly on stretcher in darkened exam room with eyes closed; resp even/unlab with no distress noted; noted O2 sat 90% ra while sleeping; O2 applied at 2l/min via [place]; will continue to monitor; pt denies any c/o upon awakening

## 2014-11-25 NOTE — ED Provider Notes (Signed)
[facility] Emergency Department Provider Note  ____________________________________________  Time seen: 2:10 AM  I have reviewed the triage vital signs and the nursing notes.  History and physical exam limited secondary to dementia  HISTORY  Chief Complaint Fall and Atrial Fibrillation      HPI Alicia Cohen is a 81 y.o. female presents status post unwitnessed fall at memory care unit at [facility]. History obtained from EMS. Patient with a hematoma around the right eye and forehead.     Past Medical History  Diagnosis Date  . Hyperlipidemia   . Dementia   . Alzheimer disease   . Hypertension   . GERD (gastroesophageal reflux disease)   . Osteoporosis   . Kidney disease, chronic, stage III (moderate, EGFR 30-59 ml/min)     There are no active problems to display for this patient.   Past Surgical History  Procedure Laterality Date  . Hip fracture surgery      No current outpatient prescriptions on file.  Allergies Review of patient's allergies indicates no known allergies.  History reviewed. No pertinent family history.  Social History History  Substance Use Topics  . Smoking status: Never Smoker   . Smokeless tobacco: Not on file  . Alcohol Use: No    Review of Systems  Constitutional: Negative for fever. Eyes: Negative for visual changes. ENT: Negative for sore throat. Cardiovascular: Negative for chest pain. Respiratory: Negative for shortness of breath. Gastrointestinal: Negative for abdominal pain, vomiting and diarrhea. Genitourinary: Negative for dysuria. Musculoskeletal: Negative for back pain. Skin: Positive for right eye swelling/contusion Neurological: Negative for headaches, focal weakness or numbness.   10-point ROS otherwise negative.  ____________________________________________   PHYSICAL EXAM:  VITAL SIGNS: ED Triage Vitals  Enc Vitals Group     BP 11/25/14 0208 167/78 mmHg     Pulse Rate  11/25/14 0208 125     Resp 11/25/14 0208 22     Temp 11/25/14 0208 99.6 F (37.6 C)     Temp Source 11/25/14 0208 Oral     SpO2 11/25/14 0208 95 %     Weight --      Height --      Head Cir --      Peak Flow --      Pain Score 11/25/14 0210 0     Pain Loc --      Pain Edu? --      Excl. in [place]? --      Constitutional: Alert and oriented. Well appearing and in no distress. Eyes: Conjunctivae are normal. PERRL. Normal extraocular movements. ENT   Head: Normocephalic and atraumatic.   Nose: No congestion/rhinnorhea.   Mouth/Throat: Mucous membranes are moist.   Neck: No stridor. Cardiovascular: Normal rate, regular rhythm. Normal and symmetric distal pulses are present in all extremities. No murmurs, rubs, or gallops. Respiratory: Normal respiratory effort without tachypnea nor retractions. Breath sounds are clear and equal bilaterally. No wheezes/rales/rhonchi. Gastrointestinal: Soft and nontender. No distention. There is no CVA tenderness. Genitourinary: deferred Musculoskeletal: Nontender with normal range of motion in all extremities. No joint effusions.  No lower extremity tenderness nor edema. Neurologic:  Normal speech and language. No gross focal neurologic deficits are appreciated. Speech is normal.  Skin:  Skin is warm, dry and intact. No rash noted. Psychiatric: Mood and affect are normal. Speech and behavior are normal. Patient exhibits appropriate insight and judgment.  ____________________________________________    LABS (pertinent positives/negatives)  Labs Reviewed  CBC - Abnormal; Notable for  the following:    WBC 16.3 (*)    RDW 16.7 (*)    All other components within normal limits  COMPREHENSIVE METABOLIC PANEL - Abnormal; Notable for the following:    Sodium 133 (*)    Chloride 96 (*)    Glucose, Bld 120 (*)    BUN 30 (*)    Creatinine, Ser 1.51 (*)    Calcium 8.7 (*)    Albumin 2.7 (*)    GFR calc non Af Amer 31 (*)    GFR calc Af Amer  36 (*)    All other components within normal limits  TROPONIN I - Abnormal; Notable for the following:    Troponin I 0.14 (*)    All other components within normal limits  CK - Abnormal; Notable for the following:    Total CK 33 (*)    All other components within normal limits  URINALYSIS COMPLETEWITH MICROSCOPIC ([facility] ONLY) - Abnormal; Notable for the following:    Color, Urine YELLOW (*)    APPearance CLEAR (*)    Hgb urine dipstick 1+ (*)    Protein, ur >500 (*)    Bacteria, UA RARE (*)    Squamous Epithelial / LPF 0-5 (*)    All other components within normal limits     ____________________________________________   EKG  ED ECG REPORT I, Maddilyn Campus, [place], the attending physician, personally viewed and interpreted this ECG.   Date: 11/25/2014  EKG Time: 2:05 AM  Rate: 122  Rhythm: Atrial fibrillation with rapid ventricular response  Axis: right axis deviation  Intervals: Irregular regular  ST&T Change: None   ____________________________________________    RADIOLOGY   IMPRESSION: CT HEAD:  1. No definite acute intracranial process identified. 2. Scalp contusion at the right forehead. 3. New area of apparent vasogenic edema within the anterior right frontal lobe. Further evaluation with dedicated MRI, with and without contrast is recommended. 4. Atrophy with chronic microvascular ischemic disease.  CT MAXILLOFACIAL:  1. Right forehead/periorbital contusion. 2. No other acute maxillofacial injury identified.  CT CERVICAL SPINE:  1. No CT evidence for acute traumatic injury within the cervical spine. 2. Scattered nodular densities with interlobular septal thickening within the visualized lung apices. Findings are indeterminate, but may reflect sequela of underlying infection and/or edema. Correlation with dedicated chest radiograph recommended.   Electronically Signed By: Jeannine Boga M.D. On: 11/25/2014 04:42     INITIAL  IMPRESSION / ASSESSMENT AND PLAN / ED COURSE  Pertinent labs & imaging results that were available during my care of the patient were reviewed by me and considered in my medical decision making (see chart for details).  History physical exam consistent  ____________________________________________   FINAL CLINICAL IMPRESSION(S) / ED DIAGNOSES  Final diagnoses:  Atrial fibrillation with rapid ventricular response  NSTEMI (non-ST elevated myocardial infarction)      Gregor Hams, MD 11/25/14 0502

## 2014-11-25 NOTE — Progress Notes (Signed)
PT Cancellation Note  Patient Details Name: Alicia Cohen MRN: [id number] DOB: 09/25/1930   Cancelled Treatment:    Reason Eval/Treat Not Completed: Patient not medically ready. Patient admitted after fall and noted to have vasogenic edema within R anterior frontal lobe and varying a-fib and sinus tachycardia. Per the charts patient has received Cardizem orally and twice by IV push today and continues to have resting HR of 117. Cardiology and neurology consults are pending. PT will hold on consult until patient HR is more stable.  Kerin RansomPatrick A McNamara, PT, DPT    11/25/2014, 3:46 PM

## 2014-11-25 NOTE — Progress Notes (Signed)
RN spoke with Dr. Gwen PoundsKowalski and made him aware that patient was admitted in Afib RVR and rate currently 130-140's with no drip ordered and 2 doses of IV cardizem was given in ED. Dr. Laruth BouchardKowaski stated "Give her diltiazem CD 240mg  PO once now and I will come see her this afternoon and d/c the lisinipril."

## 2014-11-25 NOTE — ED Notes (Signed)
Pt to CT via stretcher

## 2014-11-25 NOTE — Consult Note (Signed)
Reason for Consult: brain contusion Referring Physician: Dr. Johney Cohen is an 81 y.o. female.  HPI: seen at request of Dr. Volanda Cohen for brain contusion;  81 yo RHD F with end stage dementia presents to [facility] after fall.  She has a poor baseline and is dependent of all ADLs.   She is currently at her baseline per charts but there is concern due to atrial fibrillation and abnormal CT.  There were no seizures reported.  Past Medical History  Diagnosis Date  . Hyperlipidemia   . Dementia   . Alzheimer disease   . Hypertension   . GERD (gastroesophageal reflux disease)   . Osteoporosis   . Kidney disease, chronic, stage III (moderate, EGFR 30-59 ml/min)   . DNR (do not resuscitate)     Past Surgical History  Procedure Laterality Date  . Hip fracture surgery      History reviewed. No pertinent family history.  Social History:  reports that she has never smoked. She does not have any smokeless tobacco history on file. She reports that she does not drink alcohol. Her drug history is not on file.  Allergies: No Known Allergies  Medications: personally reviewed by me per chart  Results for orders placed or performed during the hospital encounter of 11/25/14 (from the past 48 hour(s))  MRSA PCR Screening     Status: None   Collection Time: 11/25/14  1:55 AM  Result Value Ref Range   MRSA by PCR NEGATIVE NEGATIVE    Comment:        The GeneXpert MRSA Assay (FDA approved for NASAL specimens only), is one component of a comprehensive MRSA colonization surveillance program. It is not intended to diagnose MRSA infection nor to guide or monitor treatment for MRSA infections.   CBC     Status: Abnormal   Collection Time: 11/25/14  2:20 AM  Result Value Ref Range   WBC 16.3 (H) 3.6 - 11.0 K/uL   RBC 4.44 3.80 - 5.20 MIL/uL   Hemoglobin 12.0 12.0 - 16.0 g/dL   HCT 36.9 35.0 - 47.0 %   MCV 82.9 80.0 - 100.0 fL   MCH 27.1 26.0 - 34.0 pg   MCHC 32.7 32.0 - 36.0 g/dL   RDW  16.7 (H) 11.5 - 14.5 %   Platelets 190 150 - 440 K/uL  Comprehensive metabolic panel     Status: Abnormal   Collection Time: 11/25/14  2:20 AM  Result Value Ref Range   Sodium 133 (L) 135 - 145 mmol/L   Potassium 3.7 3.5 - 5.1 mmol/L   Chloride 96 (L) 101 - 111 mmol/L   CO2 27 22 - 32 mmol/L   Glucose, Bld 120 (H) 65 - 99 mg/dL   BUN 30 (H) 6 - 20 mg/dL   Creatinine, Ser 1.51 (H) 0.44 - 1.00 mg/dL   Calcium 8.7 (L) 8.9 - 10.3 mg/dL   Total Protein 7.0 6.5 - 8.1 g/dL   Albumin 2.7 (L) 3.5 - 5.0 g/dL   AST 27 15 - 41 U/L   ALT 21 14 - 54 U/L   Alkaline Phosphatase 115 38 - 126 U/L   Total Bilirubin 0.4 0.3 - 1.2 mg/dL   GFR calc non Af Amer 31 (L) >60 mL/min   GFR calc Af Amer 36 (L) >60 mL/min    Comment: (NOTE) The eGFR has been calculated using the CKD EPI equation. This calculation has not been validated in all clinical situations. eGFR's persistently <60 mL/min  signify possible Chronic Kidney Disease.    Anion gap 10 5 - 15  Troponin I     Status: Abnormal   Collection Time: 11/25/14  2:20 AM  Result Value Ref Range   Troponin I 0.14 (H) <0.031 ng/mL    Comment: READ BACK AND VERIFIED WITH ALECIA Cohen @ 7782 7.20.16 MPG        PERSISTENTLY INCREASED TROPONIN VALUES IN THE RANGE OF 0.04-0.49 ng/mL CAN BE SEEN IN:       -UNSTABLE ANGINA       -CONGESTIVE HEART FAILURE       -MYOCARDITIS       -CHEST TRAUMA       -ARRYHTHMIAS       -LATE PRESENTING MYOCARDIAL INFARCTION       -COPD   CLINICAL FOLLOW-UP RECOMMENDED.   CK     Status: Abnormal   Collection Time: 11/25/14  2:20 AM  Result Value Ref Range   Total CK 33 (L) 38 - 234 U/L  TSH     Status: None   Collection Time: 11/25/14  2:20 AM  Result Value Ref Range   TSH 2.047 0.350 - 4.500 uIU/mL  Urinalysis complete, with microscopic ([facility] only)     Status: Abnormal   Collection Time: 11/25/14  3:43 AM  Result Value Ref Range   Color, Urine YELLOW (A) YELLOW   APPearance CLEAR (A) CLEAR   Glucose, UA  NEGATIVE NEGATIVE mg/dL   Bilirubin Urine NEGATIVE NEGATIVE   Ketones, ur NEGATIVE NEGATIVE mg/dL   Specific Gravity, Urine 1.013 1.005 - 1.030   Hgb urine dipstick 1+ (A) NEGATIVE   pH 6.0 5.0 - 8.0   Protein, ur >500 (A) NEGATIVE mg/dL   Nitrite NEGATIVE NEGATIVE   Leukocytes, UA NEGATIVE NEGATIVE   RBC / HPF 6-30 0 - 5 RBC/hpf   WBC, UA 0-5 0 - 5 WBC/hpf   Bacteria, UA RARE (A) NONE SEEN   Squamous Epithelial / LPF 0-5 (A) NONE SEEN   Mucous PRESENT    Hyaline Casts, UA PRESENT    Amorphous Crystal PRESENT   Troponin I     Status: None   Collection Time: 11/25/14  8:44 AM  Result Value Ref Range   Troponin I 0.03 <0.031 ng/mL    Comment:        NO INDICATION OF MYOCARDIAL INJURY.     Ct Head Wo Contrast  11/25/2014   CLINICAL DATA:  Initial evaluation for acute trauma.  EXAM: CT HEAD WITHOUT CONTRAST  CT MAXILLOFACIAL WITHOUT CONTRAST  CT CERVICAL SPINE WITHOUT CONTRAST  TECHNIQUE: Multidetector CT imaging of the head, cervical spine, and maxillofacial structures were performed using the standard protocol without intravenous contrast. Multiplanar CT image reconstructions of the cervical spine and maxillofacial structures were also generated.  COMPARISON:  None.  FINDINGS: CT HEAD FINDINGS  Diffuse prominence of the CSF containing spaces is compatible with generalized cerebral atrophy. Patchy and confluent hypodensity within the periventricular and deep white matter is most consistent with chronic small vessel ischemic disease.  There is new hypodensity involving predominantly the deep white matter of the anterior right frontal lobe, suspicious for possible vasogenic edema (series 2, image 14). No frank mass lesion identified. No other areas of vasogenic edema.  No acute large vessel territory infarct. No intracranial hemorrhage. No extra-axial fluid collection.  No midline shift identified. Ventricular prominence related to global parenchymal volume loss is similar to previous. No  hydrocephalus.  Scalp contusion present at the right forehead.  Scalp soft tissues are otherwise unremarkable.  Calvarium intact. Paranasal sinuses are clear. No mastoid effusion.  CT MAXILLOFACIAL FINDINGS  Right forehead/ periorbital contusion. Intact globes. No retro-orbital pathology. Sequelae of prior lens extraction noted bilaterally. Bony orbits intact without evidence of orbital floor fracture. Lamina perforation intact. Orbital roofs intact.  Zygomatic arch is intact. No maxillary fracture. Pterygoid plates intact. Nasal bones intact. Nasal septum bowed to the left but intact.  Mandible intact. Mandibular condyles normally situated within the temporomandibular fossa.  Paranasal sinuses are well pneumatized and free of fluid.  CT CERVICAL SPINE FINDINGS  The vertebral bodies are normally aligned with preservation of the normal cervical lordosis. Vertebral body heights are preserved. Normal C1-2 articulations are intact. No prevertebral soft tissue swelling. No acute fracture or listhesis. Moderate degenerative disc disease present at C4-5 and C5-6.  Visualized soft tissues of the neck demonstrate no acute abnormality. Interlobular septal thickening with multi focal nodular densities seen within the visualized lungs, indeterminate. Irregular biapical pleural thickening noted.  IMPRESSION: CT HEAD:  1. No definite acute intracranial process identified. 2. Scalp contusion at the right forehead. 3. New area of apparent vasogenic edema within the anterior right frontal lobe. Further evaluation with dedicated MRI, with and without contrast is recommended. 4. Atrophy with chronic microvascular ischemic disease.  CT MAXILLOFACIAL:  1. Right forehead/periorbital contusion. 2. No other acute maxillofacial injury identified.  CT CERVICAL SPINE:  1. No CT evidence for acute traumatic injury within the cervical spine. 2. Scattered nodular densities with interlobular septal thickening within the visualized lung apices.  Findings are indeterminate, but may reflect sequela of underlying infection and/or edema. Correlation with dedicated chest radiograph recommended.   Electronically Signed   By: Jeannine Boga M.D.   On: 11/25/2014 04:42   Ct Cervical Spine Wo Contrast  11/25/2014   CLINICAL DATA:  Initial evaluation for acute trauma.  EXAM: CT HEAD WITHOUT CONTRAST  CT MAXILLOFACIAL WITHOUT CONTRAST  CT CERVICAL SPINE WITHOUT CONTRAST  TECHNIQUE: Multidetector CT imaging of the head, cervical spine, and maxillofacial structures were performed using the standard protocol without intravenous contrast. Multiplanar CT image reconstructions of the cervical spine and maxillofacial structures were also generated.  COMPARISON:  None.  FINDINGS: CT HEAD FINDINGS  Diffuse prominence of the CSF containing spaces is compatible with generalized cerebral atrophy. Patchy and confluent hypodensity within the periventricular and deep white matter is most consistent with chronic small vessel ischemic disease.  There is new hypodensity involving predominantly the deep white matter of the anterior right frontal lobe, suspicious for possible vasogenic edema (series 2, image 14). No frank mass lesion identified. No other areas of vasogenic edema.  No acute large vessel territory infarct. No intracranial hemorrhage. No extra-axial fluid collection.  No midline shift identified. Ventricular prominence related to global parenchymal volume loss is similar to previous. No hydrocephalus.  Scalp contusion present at the right forehead. Scalp soft tissues are otherwise unremarkable.  Calvarium intact. Paranasal sinuses are clear. No mastoid effusion.  CT MAXILLOFACIAL FINDINGS  Right forehead/ periorbital contusion. Intact globes. No retro-orbital pathology. Sequelae of prior lens extraction noted bilaterally. Bony orbits intact without evidence of orbital floor fracture. Lamina perforation intact. Orbital roofs intact.  Zygomatic arch is intact. No  maxillary fracture. Pterygoid plates intact. Nasal bones intact. Nasal septum bowed to the left but intact.  Mandible intact. Mandibular condyles normally situated within the temporomandibular fossa.  Paranasal sinuses are well pneumatized and free of fluid.  CT CERVICAL SPINE FINDINGS  The vertebral  bodies are normally aligned with preservation of the normal cervical lordosis. Vertebral body heights are preserved. Normal C1-2 articulations are intact. No prevertebral soft tissue swelling. No acute fracture or listhesis. Moderate degenerative disc disease present at C4-5 and C5-6.  Visualized soft tissues of the neck demonstrate no acute abnormality. Interlobular septal thickening with multi focal nodular densities seen within the visualized lungs, indeterminate. Irregular biapical pleural thickening noted.  IMPRESSION: CT HEAD:  1. No definite acute intracranial process identified. 2. Scalp contusion at the right forehead. 3. New area of apparent vasogenic edema within the anterior right frontal lobe. Further evaluation with dedicated MRI, with and without contrast is recommended. 4. Atrophy with chronic microvascular ischemic disease.  CT MAXILLOFACIAL:  1. Right forehead/periorbital contusion. 2. No other acute maxillofacial injury identified.  CT CERVICAL SPINE:  1. No CT evidence for acute traumatic injury within the cervical spine. 2. Scattered nodular densities with interlobular septal thickening within the visualized lung apices. Findings are indeterminate, but may reflect sequela of underlying infection and/or edema. Correlation with dedicated chest radiograph recommended.   Electronically Signed   By: Jeannine Boga M.D.   On: 11/25/2014 04:42   Ct Maxillofacial Wo Cm  11/25/2014   CLINICAL DATA:  Initial evaluation for acute trauma.  EXAM: CT HEAD WITHOUT CONTRAST  CT MAXILLOFACIAL WITHOUT CONTRAST  CT CERVICAL SPINE WITHOUT CONTRAST  TECHNIQUE: Multidetector CT imaging of the head, cervical  spine, and maxillofacial structures were performed using the standard protocol without intravenous contrast. Multiplanar CT image reconstructions of the cervical spine and maxillofacial structures were also generated.  COMPARISON:  None.  FINDINGS: CT HEAD FINDINGS  Diffuse prominence of the CSF containing spaces is compatible with generalized cerebral atrophy. Patchy and confluent hypodensity within the periventricular and deep white matter is most consistent with chronic small vessel ischemic disease.  There is new hypodensity involving predominantly the deep white matter of the anterior right frontal lobe, suspicious for possible vasogenic edema (series 2, image 14). No frank mass lesion identified. No other areas of vasogenic edema.  No acute large vessel territory infarct. No intracranial hemorrhage. No extra-axial fluid collection.  No midline shift identified. Ventricular prominence related to global parenchymal volume loss is similar to previous. No hydrocephalus.  Scalp contusion present at the right forehead. Scalp soft tissues are otherwise unremarkable.  Calvarium intact. Paranasal sinuses are clear. No mastoid effusion.  CT MAXILLOFACIAL FINDINGS  Right forehead/ periorbital contusion. Intact globes. No retro-orbital pathology. Sequelae of prior lens extraction noted bilaterally. Bony orbits intact without evidence of orbital floor fracture. Lamina perforation intact. Orbital roofs intact.  Zygomatic arch is intact. No maxillary fracture. Pterygoid plates intact. Nasal bones intact. Nasal septum bowed to the left but intact.  Mandible intact. Mandibular condyles normally situated within the temporomandibular fossa.  Paranasal sinuses are well pneumatized and free of fluid.  CT CERVICAL SPINE FINDINGS  The vertebral bodies are normally aligned with preservation of the normal cervical lordosis. Vertebral body heights are preserved. Normal C1-2 articulations are intact. No prevertebral soft tissue  swelling. No acute fracture or listhesis. Moderate degenerative disc disease present at C4-5 and C5-6.  Visualized soft tissues of the neck demonstrate no acute abnormality. Interlobular septal thickening with multi focal nodular densities seen within the visualized lungs, indeterminate. Irregular biapical pleural thickening noted.  IMPRESSION: CT HEAD:  1. No definite acute intracranial process identified. 2. Scalp contusion at the right forehead. 3. New area of apparent vasogenic edema within the anterior right frontal lobe. Further evaluation  with dedicated MRI, with and without contrast is recommended. 4. Atrophy with chronic microvascular ischemic disease.  CT MAXILLOFACIAL:  1. Right forehead/periorbital contusion. 2. No other acute maxillofacial injury identified.  CT CERVICAL SPINE:  1. No CT evidence for acute traumatic injury within the cervical spine. 2. Scattered nodular densities with interlobular septal thickening within the visualized lung apices. Findings are indeterminate, but may reflect sequela of underlying infection and/or edema. Correlation with dedicated chest radiograph recommended.   Electronically Signed   By: Jeannine Boga M.D.   On: 11/25/2014 04:42    Review of Systems  Unable to perform ROS: dementia   Blood pressure 134/63, pulse 112, temperature 98.7 F (37.1 C), temperature source Oral, resp. rate 30, weight 43.9 kg (96 lb 12.5 oz), SpO2 94 %. Physical Exam  Constitutional: She appears well-developed and well-nourished. No distress.  HENT:  Head: Normocephalic.    Nose: Nose normal.  Mouth/Throat: Oropharynx is clear and moist.  Eyes: Conjunctivae and EOM are normal. Pupils are equal, round, and reactive to light.  Neck: Normal range of motion. Neck supple.  Cardiovascular: An irregularly irregular rhythm present. Tachycardia present.   No murmur heard. Respiratory: Effort normal and breath sounds normal.  GI: Soft. Bowel sounds are normal.  Neurological:  She is alert.  Alert and oriented to person only, follows most simple commands, good naming PERRLA, EOMI, face symmetric, tongue midline Moves B equally, slightly increased tone 1+/4 B, mute plantars Nl light touch  Skin: Skin is warm and dry. There is erythema.   CT of head personally reviewed by me and shows R frontal probable mass not present in prior 2016 scan  Assessment/Plan: 1.  Probable R frontal brain mass-  This does not appear to be a contusion to me and appears to be asymptomatic which further concerns me for mass;  ddx includes contusion and stroke 2.  Dementia-  End-stage and dependent of all ADLs -  MRI of brain w/o contrast -  May get CT with contrast if MRI is not good enough to help define lesion -  Would not actively anticoagulate at this juncture until we know what we are dealing with -  Needs therapy consults -  Will follow  Alicia Cohen 11/25/2014, 7:37 PM

## 2014-11-25 NOTE — Progress Notes (Signed)
Patient has gold DNR paper and is listed with DNR in Medical History in H&P, but listed as Full Code in orders. I just called the son and he confirmed her DNR status. Just paged the MD on call about the confusion and she is going to look into it and make changes as needed.

## 2014-11-25 NOTE — ED Notes (Addendum)
Pt presents to ED via EMS for a fall. EMS reports A-Fib HR-150. Hematoma noted at right eye/forehea. Pt reports left hip pain. Pt denies headache. [facility] Per EMS BP-150/76. Hx of dementia.

## 2014-11-25 NOTE — Progress Notes (Signed)
RN paged Dr. Gwen PoundsKowalski and MD called back. RN notified MD that patient converted to Sinus tach earlier in shift but that her rate remains 1 teens and 120's with frequent PACs.  RN also notified MD that Dr. Clent RidgesWalsh had ordered cardizem drip shortly after giving PO cardizem this morning and that drip had not been started. Dr. Gwen PoundsKowalski stated "you dont need to start the drip, I am coming to see her when i get done at the office." No new orders given.  Bentley Haralson B

## 2014-11-25 NOTE — Consult Note (Signed)
[facility] Consultation Note  Patient ID: Alicia Cohen, MRN: [id number], DOB/AGE: [id number] 79 y.o. Admit date: 11/25/2014   Date of Consult: 11/25/2014 Primary Physician: [place] Pink, MD Primary Cardiologist: none  Chief Complaint:  Chief Complaint  Patient presents with  . Fall  . Atrial Fibrillation   Reason for Consult: atrial fibrillation with rapid ventricular rate  HPI: 81 y.o. female with known essential hypertension mixed hyperlipidemia and chronic kidney disease stage II with acute onset of nonvalvular atrial fibrillation with rapid ventricular rate. The patient is demented and has not able to give a full history although there is been no apparent significant other concerns at this time. The patient was placed on appropriate medication management and has had some slight improvements of heart rate control but has had concerns of further tachycardia. Currently her telemetry shows sinus tachycardia with preventricular and pre-atrial contractions  Past Medical History  Diagnosis Date  . Hyperlipidemia   . Dementia   . Alzheimer disease   . Hypertension   . GERD (gastroesophageal reflux disease)   . Osteoporosis   . Kidney disease, chronic, stage III (moderate, EGFR 30-59 ml/min)   . DNR (do not resuscitate)       Surgical History:  Past Surgical History  Procedure Laterality Date  . Hip fracture surgery       Home Meds: Prior to Admission medications   Medication Sig Start Date End Date Taking? Authorizing Provider  guaifenesin (HUMIBID E) 400 MG TABS tablet Take 400 mg by mouth 2 (two) times daily.   Yes Historical Provider, MD  lisinopril (PRINIVIL,ZESTRIL) 5 MG tablet Take 5 mg by mouth daily.   Yes Historical Provider, MD  loratadine (CLARITIN) 10 MG tablet Take 10 mg by mouth daily.   Yes Historical Provider, MD  metoprolol tartrate (LOPRESSOR) 25 MG tablet Take 25 mg by mouth 2 (two) times daily.   Yes Historical Provider, MD  Multiple  Vitamin (MULTIVITAMIN) tablet Take 1 tablet by mouth daily.   Yes Historical Provider, MD  omeprazole (PRILOSEC) 40 MG capsule Take 40 mg by mouth daily.   Yes Historical Provider, MD  rivastigmine (EXELON) 9.5 mg/24hr Place 9.5 mg onto the skin daily.   Yes Historical Provider, MD    Inpatient Medications:  . docusate sodium  100 mg Oral BID  . heparin  5,000 Units Subcutaneous 3 times per day  . loratadine  10 mg Oral Daily  . metoprolol tartrate  50 mg Oral BID  . multivitamin with minerals  1 tablet Oral Daily  . pantoprazole  40 mg Oral Daily  . rivastigmine  9.5 mg Transdermal Daily   . sodium chloride 80 mL/hr at 11/25/14 1316  . diltiazem (CARDIZEM) infusion      Allergies: No Known Allergies  History   Social History  . Marital Status: Divorced    Spouse Name: N/A  . Number of Children: N/A  . Years of Education: N/A   Occupational History  . Not on file.   Social History Main Topics  . Smoking status: Never Smoker   . Smokeless tobacco: Not on file  . Alcohol Use: No  . Drug Use: Not on file  . Sexual Activity: Not on file   Other Topics Concern  . Not on file   Social History Narrative     History reviewed. No pertinent family history.   Review of Systems  Unable to do review of systems due to patient's dementia Labs:  Recent Labs  11/25/14  0220 11/25/14 0844  CKTOTAL 33*  --   TROPONINI 0.14* 0.03   Lab Results  Component Value Date   WBC 16.3* 11/25/2014   HGB 12.0 11/25/2014   HCT 36.9 11/25/2014   MCV 82.9 11/25/2014   PLT 190 11/25/2014    Recent Labs Lab 11/25/14 0220  NA 133*  K 3.7  CL 96*  CO2 27  BUN 30*  CREATININE 1.51*  CALCIUM 8.7*  PROT 7.0  BILITOT 0.4  ALKPHOS 115  ALT 21  AST 27  GLUCOSE 120*   No results found for: CHOL, HDL, LDLCALC, TRIG No results found for: DDIMER  Radiology/Studies:  Ct Head Wo Contrast  11/25/2014   CLINICAL DATA:  Initial evaluation for acute trauma.  EXAM: CT HEAD WITHOUT  CONTRAST  CT MAXILLOFACIAL WITHOUT CONTRAST  CT CERVICAL SPINE WITHOUT CONTRAST  TECHNIQUE: Multidetector CT imaging of the head, cervical spine, and maxillofacial structures were performed using the standard protocol without intravenous contrast. Multiplanar CT image reconstructions of the cervical spine and maxillofacial structures were also generated.  COMPARISON:  None.  FINDINGS: CT HEAD FINDINGS  Diffuse prominence of the CSF containing spaces is compatible with generalized cerebral atrophy. Patchy and confluent hypodensity within the periventricular and deep white matter is most consistent with chronic small vessel ischemic disease.  There is new hypodensity involving predominantly the deep white matter of the anterior right frontal lobe, suspicious for possible vasogenic edema (series 2, image 14). No frank mass lesion identified. No other areas of vasogenic edema.  No acute large vessel territory infarct. No intracranial hemorrhage. No extra-axial fluid collection.  No midline shift identified. Ventricular prominence related to global parenchymal volume loss is similar to previous. No hydrocephalus.  Scalp contusion present at the right forehead. Scalp soft tissues are otherwise unremarkable.  Calvarium intact. Paranasal sinuses are clear. No mastoid effusion.  CT MAXILLOFACIAL FINDINGS  Right forehead/ periorbital contusion. Intact globes. No retro-orbital pathology. Sequelae of prior lens extraction noted bilaterally. Bony orbits intact without evidence of orbital floor fracture. Lamina perforation intact. Orbital roofs intact.  Zygomatic arch is intact. No maxillary fracture. Pterygoid plates intact. Nasal bones intact. Nasal septum bowed to the left but intact.  Mandible intact. Mandibular condyles normally situated within the temporomandibular fossa.  Paranasal sinuses are well pneumatized and free of fluid.  CT CERVICAL SPINE FINDINGS  The vertebral bodies are normally aligned with preservation of the  normal cervical lordosis. Vertebral body heights are preserved. Normal C1-2 articulations are intact. No prevertebral soft tissue swelling. No acute fracture or listhesis. Moderate degenerative disc disease present at C4-5 and C5-6.  Visualized soft tissues of the neck demonstrate no acute abnormality. Interlobular septal thickening with multi focal nodular densities seen within the visualized lungs, indeterminate. Irregular biapical pleural thickening noted.  IMPRESSION: CT HEAD:  1. No definite acute intracranial process identified. 2. Scalp contusion at the right forehead. 3. New area of apparent vasogenic edema within the anterior right frontal lobe. Further evaluation with dedicated MRI, with and without contrast is recommended. 4. Atrophy with chronic microvascular ischemic disease.  CT MAXILLOFACIAL:  1. Right forehead/periorbital contusion. 2. No other acute maxillofacial injury identified.  CT CERVICAL SPINE:  1. No CT evidence for acute traumatic injury within the cervical spine. 2. Scattered nodular densities with interlobular septal thickening within the visualized lung apices. Findings are indeterminate, but may reflect sequela of underlying infection and/or edema. Correlation with dedicated chest radiograph recommended.   Electronically Signed   By: Pincus Badder.D.  On: 11/25/2014 04:42   Ct Cervical Spine Wo Contrast  11/25/2014   CLINICAL DATA:  Initial evaluation for acute trauma.  EXAM: CT HEAD WITHOUT CONTRAST  CT MAXILLOFACIAL WITHOUT CONTRAST  CT CERVICAL SPINE WITHOUT CONTRAST  TECHNIQUE: Multidetector CT imaging of the head, cervical spine, and maxillofacial structures were performed using the standard protocol without intravenous contrast. Multiplanar CT image reconstructions of the cervical spine and maxillofacial structures were also generated.  COMPARISON:  None.  FINDINGS: CT HEAD FINDINGS  Diffuse prominence of the CSF containing spaces is compatible with generalized  cerebral atrophy. Patchy and confluent hypodensity within the periventricular and deep white matter is most consistent with chronic small vessel ischemic disease.  There is new hypodensity involving predominantly the deep white matter of the anterior right frontal lobe, suspicious for possible vasogenic edema (series 2, image 14). No frank mass lesion identified. No other areas of vasogenic edema.  No acute large vessel territory infarct. No intracranial hemorrhage. No extra-axial fluid collection.  No midline shift identified. Ventricular prominence related to global parenchymal volume loss is similar to previous. No hydrocephalus.  Scalp contusion present at the right forehead. Scalp soft tissues are otherwise unremarkable.  Calvarium intact. Paranasal sinuses are clear. No mastoid effusion.  CT MAXILLOFACIAL FINDINGS  Right forehead/ periorbital contusion. Intact globes. No retro-orbital pathology. Sequelae of prior lens extraction noted bilaterally. Bony orbits intact without evidence of orbital floor fracture. Lamina perforation intact. Orbital roofs intact.  Zygomatic arch is intact. No maxillary fracture. Pterygoid plates intact. Nasal bones intact. Nasal septum bowed to the left but intact.  Mandible intact. Mandibular condyles normally situated within the temporomandibular fossa.  Paranasal sinuses are well pneumatized and free of fluid.  CT CERVICAL SPINE FINDINGS  The vertebral bodies are normally aligned with preservation of the normal cervical lordosis. Vertebral body heights are preserved. Normal C1-2 articulations are intact. No prevertebral soft tissue swelling. No acute fracture or listhesis. Moderate degenerative disc disease present at C4-5 and C5-6.  Visualized soft tissues of the neck demonstrate no acute abnormality. Interlobular septal thickening with multi focal nodular densities seen within the visualized lungs, indeterminate. Irregular biapical pleural thickening noted.  IMPRESSION: CT  HEAD:  1. No definite acute intracranial process identified. 2. Scalp contusion at the right forehead. 3. New area of apparent vasogenic edema within the anterior right frontal lobe. Further evaluation with dedicated MRI, with and without contrast is recommended. 4. Atrophy with chronic microvascular ischemic disease.  CT MAXILLOFACIAL:  1. Right forehead/periorbital contusion. 2. No other acute maxillofacial injury identified.  CT CERVICAL SPINE:  1. No CT evidence for acute traumatic injury within the cervical spine. 2. Scattered nodular densities with interlobular septal thickening within the visualized lung apices. Findings are indeterminate, but may reflect sequela of underlying infection and/or edema. Correlation with dedicated chest radiograph recommended.   Electronically Signed   By: Jeannine Boga M.D.   On: 11/25/2014 04:42   Ct Maxillofacial Wo Cm  11/25/2014   CLINICAL DATA:  Initial evaluation for acute trauma.  EXAM: CT HEAD WITHOUT CONTRAST  CT MAXILLOFACIAL WITHOUT CONTRAST  CT CERVICAL SPINE WITHOUT CONTRAST  TECHNIQUE: Multidetector CT imaging of the head, cervical spine, and maxillofacial structures were performed using the standard protocol without intravenous contrast. Multiplanar CT image reconstructions of the cervical spine and maxillofacial structures were also generated.  COMPARISON:  None.  FINDINGS: CT HEAD FINDINGS  Diffuse prominence of the CSF containing spaces is compatible with generalized cerebral atrophy. Patchy and confluent hypodensity within the  periventricular and deep white matter is most consistent with chronic small vessel ischemic disease.  There is new hypodensity involving predominantly the deep white matter of the anterior right frontal lobe, suspicious for possible vasogenic edema (series 2, image 14). No frank mass lesion identified. No other areas of vasogenic edema.  No acute large vessel territory infarct. No intracranial hemorrhage. No extra-axial fluid  collection.  No midline shift identified. Ventricular prominence related to global parenchymal volume loss is similar to previous. No hydrocephalus.  Scalp contusion present at the right forehead. Scalp soft tissues are otherwise unremarkable.  Calvarium intact. Paranasal sinuses are clear. No mastoid effusion.  CT MAXILLOFACIAL FINDINGS  Right forehead/ periorbital contusion. Intact globes. No retro-orbital pathology. Sequelae of prior lens extraction noted bilaterally. Bony orbits intact without evidence of orbital floor fracture. Lamina perforation intact. Orbital roofs intact.  Zygomatic arch is intact. No maxillary fracture. Pterygoid plates intact. Nasal bones intact. Nasal septum bowed to the left but intact.  Mandible intact. Mandibular condyles normally situated within the temporomandibular fossa.  Paranasal sinuses are well pneumatized and free of fluid.  CT CERVICAL SPINE FINDINGS  The vertebral bodies are normally aligned with preservation of the normal cervical lordosis. Vertebral body heights are preserved. Normal C1-2 articulations are intact. No prevertebral soft tissue swelling. No acute fracture or listhesis. Moderate degenerative disc disease present at C4-5 and C5-6.  Visualized soft tissues of the neck demonstrate no acute abnormality. Interlobular septal thickening with multi focal nodular densities seen within the visualized lungs, indeterminate. Irregular biapical pleural thickening noted.  IMPRESSION: CT HEAD:  1. No definite acute intracranial process identified. 2. Scalp contusion at the right forehead. 3. New area of apparent vasogenic edema within the anterior right frontal lobe. Further evaluation with dedicated MRI, with and without contrast is recommended. 4. Atrophy with chronic microvascular ischemic disease.  CT MAXILLOFACIAL:  1. Right forehead/periorbital contusion. 2. No other acute maxillofacial injury identified.  CT CERVICAL SPINE:  1. No CT evidence for acute traumatic  injury within the cervical spine. 2. Scattered nodular densities with interlobular septal thickening within the visualized lung apices. Findings are indeterminate, but may reflect sequela of underlying infection and/or edema. Correlation with dedicated chest radiograph recommended.   Electronically Signed   By: Jeannine Boga M.D.   On: 11/25/2014 04:42    EKG: Atrial fibrillation with rapid ventricular rate and nonspecific ST and T-wave changes  Weights: Filed Weights   11/25/14 0929  Weight: 96 lb 12.5 oz (43.9 kg)     Physical Exam: Blood pressure 119/56, pulse 99, temperature 98.7 F (37.1 C), temperature source Oral, resp. rate 26, weight 96 lb 12.5 oz (43.9 kg), SpO2 98 %. Body mass index is 18.9 kg/(m^2). General: Well developed, well nourished, in no acute distress. Head eyes ears nose throat: Normocephalic, atraumatic, sclera non-icteric, no xanthomas, nares are without discharge. No apparent thyromegaly and/or mass  Lungs: Normal respiratory effort.  Few wheezes, no rales, no rhonchi.  Heart: Irregular and rapid with normal S1 S2. no murmur gallop, no rub, PMI is normal size and placement, carotid upstroke normal without bruit, jugular venous pressure is normal Abdomen: Soft, non-tender, non-distended with normoactive bowel sounds. No hepatomegaly. No rebound/guarding. No obvious abdominal masses. Abdominal aorta is normal size without bruit Extremities: Trace edema. no cyanosis, no clubbing, no ulcers  Peripheral : 2+ bilateral upper extremity pulses, 2+ bilateral femoral pulses, 2+ bilateral dorsal pedal pulse Neuro: Alert   No facial asymmetry. No focal deficit. Moves all extremities spontaneously.  Musculoskeletal: Normal muscle tone without kyphosis     Assessment: 81 year old female with essential hypertension chronic kidney disease stage II with atrial fibrillation with rapid ventricular rate and elevated troponin consistent with demand ischemia rather than acute  coronary syndrome  Plan: 1. Continue diltiazem and metoprolol combination for heart rate control of supraventricular tachycardia 2. No addition of anticoagulation at this time due to concerns of fall risk and bleeding risk compliance risk and maintenance of normal sinus rhythms despite tachycardia at this time 3. No further intervention of animal elevation of troponin consistent with demand ischemia 4. Echocardiogram for LV systolic dysfunction valvular heart disease contributing to above 5. Further diagnostic testing and treatment options after above  Signed, Corey Skains M.D. [facility] 11/25/2014, 5:41 PM

## 2014-11-25 NOTE — Care Management Note (Addendum)
Case Management Note  Patient Details  Name: Alicia Cohen MRN: [id number] Date of Birth: 11/22/1930  Subjective/Objective:   Admitted following [facility].  Found to be in a fib with rvr with HR in 110-115. No family at bedside at this time. CSW updated.         Action/Plan:   Expected Discharge Date:                  Expected Discharge Plan:  Skilled Nursing Facility  In-House Referral:     Discharge planning Services     Post Acute Care Choice:    Choice offered to:     DME Arranged:    DME Agency:     HH Arranged:    HH Agency:     Status of Service:  In process, will continue to follow  Medicare Important Message Given:    Date Medicare IM Given:    Medicare IM give by:    Date Additional Medicare IM Given:    Additional Medicare Important Message give by:     If discussed at Long Length of Stay Meetings, dates discussed:    Additional Comments:  Marily MemosLisa M Brance Dartt, RN 11/25/2014, 2:26 PM

## 2014-11-25 NOTE — Progress Notes (Signed)
[facility] [place] [facility]   PATIENT NAME: Alicia Cohen    MR#:  [id number]  DATE OF BIRTH:  1930-11-21  SUBJECTIVE:  CHIEF COMPLAINT:   Chief Complaint  Patient presents with  . Fall  . Atrial Fibrillation   No complaints. Has a large black eye on the right. Denies pain. Denies chest pain shortness of breath or palpitations. Has dementia, poor historian  REVIEW OF SYSTEMS:   Review of Systems  Constitutional: Negative for fever.  Respiratory: Negative for shortness of breath.   Cardiovascular: Negative for chest pain and palpitations.  Gastrointestinal: Negative for nausea, vomiting and abdominal pain.  Genitourinary: Negative for dysuria.    DRUG ALLERGIES:  No Known Allergies  VITALS:  Blood pressure 137/62, pulse 121, temperature 98.9 F (37.2 C), temperature source Oral, resp. rate 25, weight 43.9 kg (96 lb 12.5 oz), SpO2 93 %.  PHYSICAL EXAMINATION:  GENERAL:  81 y.o.-year-old patient lying in the bed with no acute distress. Thin, black eye, ill appearing EYES: Pupils equal, round, reactive to light and accommodation. No scleral icterus. Extraocular muscles intact.  HEENT: Swelling bruising over the right side of the forehead and right eye.  NECK:  Supple, no jugular venous distention. No thyroid enlargement, no tenderness.  LUNGS: Short shallow respirations with diffuse crackles, fair air movement CARDIOVASCULAR: Tachycardic, irregular, distant S1, S2 normal. No murmurs, rubs, or gallops.  ABDOMEN: Soft, nontender, nondistended. Bowel sounds present. No organomegaly or mass.  EXTREMITIES: No pedal edema, cyanosis, or clubbing. Muscle wasting NEUROLOGIC: Does not participate  PSYCHIATRIC: Unable to assess    LABORATORY PANEL:   CBC  Recent Labs Lab 11/25/14 0220  WBC 16.3*  HGB 12.0  HCT 36.9  PLT 190    ------------------------------------------------------------------------------------------------------------------  Chemistries   Recent Labs Lab 11/25/14 0220  NA 133*  K 3.7  CL 96*  CO2 27  GLUCOSE 120*  BUN 30*  CREATININE 1.51*  CALCIUM 8.7*  AST 27  ALT 21  ALKPHOS 115  BILITOT 0.4   ------------------------------------------------------------------------------------------------------------------  Cardiac Enzymes  Recent Labs Lab 11/25/14 0844  TROPONINI 0.03   ------------------------------------------------------------------------------------------------------------------  RADIOLOGY:  Ct Head Wo Contrast  11/25/2014   CLINICAL DATA:  Initial evaluation for acute trauma.  EXAM: CT HEAD WITHOUT CONTRAST  CT MAXILLOFACIAL WITHOUT CONTRAST  CT CERVICAL SPINE WITHOUT CONTRAST  TECHNIQUE: Multidetector CT imaging of the head, cervical spine, and maxillofacial structures were performed using the standard protocol without intravenous contrast. Multiplanar CT image reconstructions of the cervical spine and maxillofacial structures were also generated.  COMPARISON:  None.  FINDINGS: CT HEAD FINDINGS  Diffuse prominence of the CSF containing spaces is compatible with generalized cerebral atrophy. Patchy and confluent hypodensity within the periventricular and deep white matter is most consistent with chronic small vessel ischemic disease.  There is new hypodensity involving predominantly the deep white matter of the anterior right frontal lobe, suspicious for possible vasogenic edema (series 2, image 14). No frank mass lesion identified. No other areas of vasogenic edema.  No acute large vessel territory infarct. No intracranial hemorrhage. No extra-axial fluid collection.  No midline shift identified. Ventricular prominence related to global parenchymal volume loss is similar to previous. No hydrocephalus.  Scalp contusion present at the right forehead. Scalp soft tissues are  otherwise unremarkable.  Calvarium intact. Paranasal sinuses are clear. No mastoid effusion.  CT MAXILLOFACIAL FINDINGS  Right forehead/ periorbital contusion. Intact globes. No retro-orbital pathology. Sequelae of prior lens extraction noted bilaterally. Bony orbits intact  without evidence of orbital floor fracture. Lamina perforation intact. Orbital roofs intact.  Zygomatic arch is intact. No maxillary fracture. Pterygoid plates intact. Nasal bones intact. Nasal septum bowed to the left but intact.  Mandible intact. Mandibular condyles normally situated within the temporomandibular fossa.  Paranasal sinuses are well pneumatized and free of fluid.  CT CERVICAL SPINE FINDINGS  The vertebral bodies are normally aligned with preservation of the normal cervical lordosis. Vertebral body heights are preserved. Normal C1-2 articulations are intact. No prevertebral soft tissue swelling. No acute fracture or listhesis. Moderate degenerative disc disease present at C4-5 and C5-6.  Visualized soft tissues of the neck demonstrate no acute abnormality. Interlobular septal thickening with multi focal nodular densities seen within the visualized lungs, indeterminate. Irregular biapical pleural thickening noted.  IMPRESSION: CT HEAD:  1. No definite acute intracranial process identified. 2. Scalp contusion at the right forehead. 3. New area of apparent vasogenic edema within the anterior right frontal lobe. Further evaluation with dedicated MRI, with and without contrast is recommended. 4. Atrophy with chronic microvascular ischemic disease.  CT MAXILLOFACIAL:  1. Right forehead/periorbital contusion. 2. No other acute maxillofacial injury identified.  CT CERVICAL SPINE:  1. No CT evidence for acute traumatic injury within the cervical spine. 2. Scattered nodular densities with interlobular septal thickening within the visualized lung apices. Findings are indeterminate, but may reflect sequela of underlying infection and/or edema.  Correlation with dedicated chest radiograph recommended.   Electronically Signed   By: Rise MuBenjamin  McClintock M.D.   On: 11/25/2014 04:42   Ct Cervical Spine Wo Contrast  11/25/2014   CLINICAL DATA:  Initial evaluation for acute trauma.  EXAM: CT HEAD WITHOUT CONTRAST  CT MAXILLOFACIAL WITHOUT CONTRAST  CT CERVICAL SPINE WITHOUT CONTRAST  TECHNIQUE: Multidetector CT imaging of the head, cervical spine, and maxillofacial structures were performed using the standard protocol without intravenous contrast. Multiplanar CT image reconstructions of the cervical spine and maxillofacial structures were also generated.  COMPARISON:  None.  FINDINGS: CT HEAD FINDINGS  Diffuse prominence of the CSF containing spaces is compatible with generalized cerebral atrophy. Patchy and confluent hypodensity within the periventricular and deep white matter is most consistent with chronic small vessel ischemic disease.  There is new hypodensity involving predominantly the deep white matter of the anterior right frontal lobe, suspicious for possible vasogenic edema (series 2, image 14). No frank mass lesion identified. No other areas of vasogenic edema.  No acute large vessel territory infarct. No intracranial hemorrhage. No extra-axial fluid collection.  No midline shift identified. Ventricular prominence related to global parenchymal volume loss is similar to previous. No hydrocephalus.  Scalp contusion present at the right forehead. Scalp soft tissues are otherwise unremarkable.  Calvarium intact. Paranasal sinuses are clear. No mastoid effusion.  CT MAXILLOFACIAL FINDINGS  Right forehead/ periorbital contusion. Intact globes. No retro-orbital pathology. Sequelae of prior lens extraction noted bilaterally. Bony orbits intact without evidence of orbital floor fracture. Lamina perforation intact. Orbital roofs intact.  Zygomatic arch is intact. No maxillary fracture. Pterygoid plates intact. Nasal bones intact. Nasal septum bowed to the  left but intact.  Mandible intact. Mandibular condyles normally situated within the temporomandibular fossa.  Paranasal sinuses are well pneumatized and free of fluid.  CT CERVICAL SPINE FINDINGS  The vertebral bodies are normally aligned with preservation of the normal cervical lordosis. Vertebral body heights are preserved. Normal C1-2 articulations are intact. No prevertebral soft tissue swelling. No acute fracture or listhesis. Moderate degenerative disc disease present at C4-5 and C5-6.  Visualized soft tissues of the neck demonstrate no acute abnormality. Interlobular septal thickening with multi focal nodular densities seen within the visualized lungs, indeterminate. Irregular biapical pleural thickening noted.  IMPRESSION: CT HEAD:  1. No definite acute intracranial process identified. 2. Scalp contusion at the right forehead. 3. New area of apparent vasogenic edema within the anterior right frontal lobe. Further evaluation with dedicated MRI, with and without contrast is recommended. 4. Atrophy with chronic microvascular ischemic disease.  CT MAXILLOFACIAL:  1. Right forehead/periorbital contusion. 2. No other acute maxillofacial injury identified.  CT CERVICAL SPINE:  1. No CT evidence for acute traumatic injury within the cervical spine. 2. Scattered nodular densities with interlobular septal thickening within the visualized lung apices. Findings are indeterminate, but may reflect sequela of underlying infection and/or edema. Correlation with dedicated chest radiograph recommended.   Electronically Signed   By: Rise Mu M.D.   On: 11/25/2014 04:42   Ct Maxillofacial Wo Cm  11/25/2014   CLINICAL DATA:  Initial evaluation for acute trauma.  EXAM: CT HEAD WITHOUT CONTRAST  CT MAXILLOFACIAL WITHOUT CONTRAST  CT CERVICAL SPINE WITHOUT CONTRAST  TECHNIQUE: Multidetector CT imaging of the head, cervical spine, and maxillofacial structures were performed using the standard protocol without  intravenous contrast. Multiplanar CT image reconstructions of the cervical spine and maxillofacial structures were also generated.  COMPARISON:  None.  FINDINGS: CT HEAD FINDINGS  Diffuse prominence of the CSF containing spaces is compatible with generalized cerebral atrophy. Patchy and confluent hypodensity within the periventricular and deep white matter is most consistent with chronic small vessel ischemic disease.  There is new hypodensity involving predominantly the deep white matter of the anterior right frontal lobe, suspicious for possible vasogenic edema (series 2, image 14). No frank mass lesion identified. No other areas of vasogenic edema.  No acute large vessel territory infarct. No intracranial hemorrhage. No extra-axial fluid collection.  No midline shift identified. Ventricular prominence related to global parenchymal volume loss is similar to previous. No hydrocephalus.  Scalp contusion present at the right forehead. Scalp soft tissues are otherwise unremarkable.  Calvarium intact. Paranasal sinuses are clear. No mastoid effusion.  CT MAXILLOFACIAL FINDINGS  Right forehead/ periorbital contusion. Intact globes. No retro-orbital pathology. Sequelae of prior lens extraction noted bilaterally. Bony orbits intact without evidence of orbital floor fracture. Lamina perforation intact. Orbital roofs intact.  Zygomatic arch is intact. No maxillary fracture. Pterygoid plates intact. Nasal bones intact. Nasal septum bowed to the left but intact.  Mandible intact. Mandibular condyles normally situated within the temporomandibular fossa.  Paranasal sinuses are well pneumatized and free of fluid.  CT CERVICAL SPINE FINDINGS  The vertebral bodies are normally aligned with preservation of the normal cervical lordosis. Vertebral body heights are preserved. Normal C1-2 articulations are intact. No prevertebral soft tissue swelling. No acute fracture or listhesis. Moderate degenerative disc disease present at C4-5  and C5-6.  Visualized soft tissues of the neck demonstrate no acute abnormality. Interlobular septal thickening with multi focal nodular densities seen within the visualized lungs, indeterminate. Irregular biapical pleural thickening noted.  IMPRESSION: CT HEAD:  1. No definite acute intracranial process identified. 2. Scalp contusion at the right forehead. 3. New area of apparent vasogenic edema within the anterior right frontal lobe. Further evaluation with dedicated MRI, with and without contrast is recommended. 4. Atrophy with chronic microvascular ischemic disease.  CT MAXILLOFACIAL:  1. Right forehead/periorbital contusion. 2. No other acute maxillofacial injury identified.  CT CERVICAL SPINE:  1. No CT evidence for  acute traumatic injury within the cervical spine. 2. Scattered nodular densities with interlobular septal thickening within the visualized lung apices. Findings are indeterminate, but may reflect sequela of underlying infection and/or edema. Correlation with dedicated chest radiograph recommended.   Electronically Signed   By: Rise Mu M.D.   On: 11/25/2014 04:42    EKG:   Orders placed or performed during the hospital encounter of 11/25/14  . ED EKG  . ED EKG    ASSESSMENT AND PLAN:   #1 atrial fibrillation with RVR: Rate still in the 110s to 1 teens. She has received 240 mg of by mouth Cardizem this morning in addition to 2 IV pushes of 5 mg each. If heart rate increases above 120 Will start a drip. Cardiology to see today. Question of whether she should be on anticoagulation. Currently in sinus tachycardia.  #2 elevated troponin: This was likely due to tachycardia, now decreased back to normal.  #3 fall with head injury: Neurology consultation pending due to vasogenic edema within the right frontal lobe. Obtain physical therapy consultation once stable for fall prevention.  #4 chronic kidney disease stage 4: Stable  #5 dementia: Stable. Her husband and caregiver  are at the bedside. No sitter is needed.  Prophylaxis heparin for DVT prophylaxis, no GI prophylaxis   CODE STATUS: Full  TOTAL TIME TAKING CARE OF THIS PATIENT: 40 minutes.  Greater than 50% of time spent in care coordination and counseling. All the records are reviewed and case discussed with Care Management/Social Worker. Management plans discussed with the patient, family and they are in agreement. Spoke with her husband and caregiver at length.  POSSIBLE D/C IN 2-3 DAYS, DEPENDING ON CLINICAL CONDITION.   Elby Showers M.D on 11/25/2014 at 1:56 PM  Between 7am to 6pm - Pager - [phone]  After 6pm go to [email or web address] - password [email or web address] [facility]  [facility]  Office  [phone]  CC: Primary care physician; Jerl Mina, MD

## 2014-11-25 NOTE — H&P (Addendum)
Alicia Cohen is an 81 y.o. female.   Chief Complaint: Fall HPI: Patient with history of dementia presents to the emergency department via EMS after an unwitnessed fall at her nursing facility. On arrival patient was found to have atrial fibrillation with rapid ventricular rate which slowed after diltiazem 5 mg IV. CT of the head revealed an area of vasogenic edema in the vicinity of the contusion on the right forehead however physical exam reveals nothing aside from bruising of her face and head as well as poor memory. The patient's heart rate would intermittently increase and vary between sinus rhythm and atrial fibrillation which prompted emergency department staff to call for admission.  Past Medical History  Diagnosis Date  . Hyperlipidemia   . Dementia   . Alzheimer disease   . Hypertension   . GERD (gastroesophageal reflux disease)   . Osteoporosis   . Kidney disease, chronic, stage III (moderate, EGFR 30-59 ml/min)   . DNR (do not resuscitate)     Past Surgical History  Procedure Laterality Date  . Hip fracture surgery      History reviewed. No pertinent family history. Social History:  reports that she has never smoked. She does not have any smokeless tobacco history on file. She reports that she does not drink alcohol. Her drug history is not on file.  Allergies: No Known Allergies  Prior to Admission medications   Medication Sig Start Date End Date Taking? Authorizing Provider  guaifenesin (HUMIBID E) 400 MG TABS tablet Take 400 mg by mouth 2 (two) times daily.   Yes Historical Provider, MD  lisinopril (PRINIVIL,ZESTRIL) 5 MG tablet Take 5 mg by mouth daily.   Yes Historical Provider, MD  loratadine (CLARITIN) 10 MG tablet Take 10 mg by mouth daily.   Yes Historical Provider, MD  metoprolol tartrate (LOPRESSOR) 25 MG tablet Take 25 mg by mouth 2 (two) times daily.   Yes Historical Provider, MD  Multiple Vitamin (MULTIVITAMIN) tablet Take 1 tablet by mouth daily.   Yes  Historical Provider, MD  omeprazole (PRILOSEC) 40 MG capsule Take 40 mg by mouth daily.   Yes Historical Provider, MD  rivastigmine (EXELON) 9.5 mg/24hr Place 9.5 mg onto the skin daily.   Yes Historical Provider, MD     Results for orders placed or performed during the hospital encounter of 11/25/14 (from the past 48 hour(s))  CBC     Status: Abnormal   Collection Time: 11/25/14  2:20 AM  Result Value Ref Range   WBC 16.3 (H) 3.6 - 11.0 K/uL   RBC 4.44 3.80 - 5.20 MIL/uL   Hemoglobin 12.0 12.0 - 16.0 g/dL   HCT 36.9 35.0 - 47.0 %   MCV 82.9 80.0 - 100.0 fL   MCH 27.1 26.0 - 34.0 pg   MCHC 32.7 32.0 - 36.0 g/dL   RDW 16.7 (H) 11.5 - 14.5 %   Platelets 190 150 - 440 K/uL  Comprehensive metabolic panel     Status: Abnormal   Collection Time: 11/25/14  2:20 AM  Result Value Ref Range   Sodium 133 (L) 135 - 145 mmol/L   Potassium 3.7 3.5 - 5.1 mmol/L   Chloride 96 (L) 101 - 111 mmol/L   CO2 27 22 - 32 mmol/L   Glucose, Bld 120 (H) 65 - 99 mg/dL   BUN 30 (H) 6 - 20 mg/dL   Creatinine, Ser 1.51 (H) 0.44 - 1.00 mg/dL   Calcium 8.7 (L) 8.9 - 10.3 mg/dL   Total  Protein 7.0 6.5 - 8.1 g/dL   Albumin 2.7 (L) 3.5 - 5.0 g/dL   AST 27 15 - 41 U/L   ALT 21 14 - 54 U/L   Alkaline Phosphatase 115 38 - 126 U/L   Total Bilirubin 0.4 0.3 - 1.2 mg/dL   GFR calc non Af Amer 31 (L) >60 mL/min   GFR calc Af Amer 36 (L) >60 mL/min    Comment: (NOTE) The eGFR has been calculated using the CKD EPI equation. This calculation has not been validated in all clinical situations. eGFR's persistently <60 mL/min signify possible Chronic Kidney Disease.    Anion gap 10 5 - 15  Troponin I     Status: Abnormal   Collection Time: 11/25/14  2:20 AM  Result Value Ref Range   Troponin I 0.14 (H) <0.031 ng/mL    Comment: READ BACK AND VERIFIED WITH [name] @ 9937 7.20.16 MPG        PERSISTENTLY INCREASED TROPONIN VALUES IN THE RANGE OF 0.04-0.49 ng/mL CAN BE SEEN IN:       -UNSTABLE ANGINA        -CONGESTIVE HEART FAILURE       -MYOCARDITIS       -CHEST TRAUMA       -ARRYHTHMIAS       -LATE PRESENTING MYOCARDIAL INFARCTION       -COPD   CLINICAL FOLLOW-UP RECOMMENDED.   CK     Status: Abnormal   Collection Time: 11/25/14  2:20 AM  Result Value Ref Range   Total CK 33 (L) 38 - 234 U/L  Urinalysis complete, with microscopic ([facility] only)     Status: Abnormal   Collection Time: 11/25/14  3:43 AM  Result Value Ref Range   Color, Urine YELLOW (A) YELLOW   APPearance CLEAR (A) CLEAR   Glucose, UA NEGATIVE NEGATIVE mg/dL   Bilirubin Urine NEGATIVE NEGATIVE   Ketones, ur NEGATIVE NEGATIVE mg/dL   Specific Gravity, Urine 1.013 1.005 - 1.030   Hgb urine dipstick 1+ (A) NEGATIVE   pH 6.0 5.0 - 8.0   Protein, ur >500 (A) NEGATIVE mg/dL   Nitrite NEGATIVE NEGATIVE   Leukocytes, UA NEGATIVE NEGATIVE   RBC / HPF 6-30 0 - 5 RBC/hpf   WBC, UA 0-5 0 - 5 WBC/hpf   Bacteria, UA RARE (A) NONE SEEN   Squamous Epithelial / LPF 0-5 (A) NONE SEEN   Mucous PRESENT    Hyaline Casts, UA PRESENT    Amorphous Crystal PRESENT    Ct Head Wo Contrast  11/25/2014   CLINICAL DATA:  Initial evaluation for acute trauma.  EXAM: CT HEAD WITHOUT CONTRAST  CT MAXILLOFACIAL WITHOUT CONTRAST  CT CERVICAL SPINE WITHOUT CONTRAST  TECHNIQUE: Multidetector CT imaging of the head, cervical spine, and maxillofacial structures were performed using the standard protocol without intravenous contrast. Multiplanar CT image reconstructions of the cervical spine and maxillofacial structures were also generated.  COMPARISON:  None.  FINDINGS: CT HEAD FINDINGS  Diffuse prominence of the CSF containing spaces is compatible with generalized cerebral atrophy. Patchy and confluent hypodensity within the periventricular and deep white matter is most consistent with chronic small vessel ischemic disease.  There is new hypodensity involving predominantly the deep white matter of the anterior right frontal lobe, suspicious for possible  vasogenic edema (series 2, image 14). No frank mass lesion identified. No other areas of vasogenic edema.  No acute large vessel territory infarct. No intracranial hemorrhage. No extra-axial fluid collection.  No midline shift  identified. Ventricular prominence related to global parenchymal volume loss is similar to previous. No hydrocephalus.  Scalp contusion present at the right forehead. Scalp soft tissues are otherwise unremarkable.  Calvarium intact. Paranasal sinuses are clear. No mastoid effusion.  CT MAXILLOFACIAL FINDINGS  Right forehead/ periorbital contusion. Intact globes. No retro-orbital pathology. Sequelae of prior lens extraction noted bilaterally. Bony orbits intact without evidence of orbital floor fracture. Lamina perforation intact. Orbital roofs intact.  Zygomatic arch is intact. No maxillary fracture. Pterygoid plates intact. Nasal bones intact. Nasal septum bowed to the left but intact.  Mandible intact. Mandibular condyles normally situated within the temporomandibular fossa.  Paranasal sinuses are well pneumatized and free of fluid.  CT CERVICAL SPINE FINDINGS  The vertebral bodies are normally aligned with preservation of the normal cervical lordosis. Vertebral body heights are preserved. Normal C1-2 articulations are intact. No prevertebral soft tissue swelling. No acute fracture or listhesis. Moderate degenerative disc disease present at C4-5 and C5-6.  Visualized soft tissues of the neck demonstrate no acute abnormality. Interlobular septal thickening with multi focal nodular densities seen within the visualized lungs, indeterminate. Irregular biapical pleural thickening noted.  IMPRESSION: CT HEAD:  1. No definite acute intracranial process identified. 2. Scalp contusion at the right forehead. 3. New area of apparent vasogenic edema within the anterior right frontal lobe. Further evaluation with dedicated MRI, with and without contrast is recommended. 4. Atrophy with chronic  microvascular ischemic disease.  CT MAXILLOFACIAL:  1. Right forehead/periorbital contusion. 2. No other acute maxillofacial injury identified.  CT CERVICAL SPINE:  1. No CT evidence for acute traumatic injury within the cervical spine. 2. Scattered nodular densities with interlobular septal thickening within the visualized lung apices. Findings are indeterminate, but may reflect sequela of underlying infection and/or edema. Correlation with dedicated chest radiograph recommended.   Electronically Signed   By: Jeannine Boga M.D.   On: 11/25/2014 04:42   Ct Cervical Spine Wo Contrast  11/25/2014   CLINICAL DATA:  Initial evaluation for acute trauma.  EXAM: CT HEAD WITHOUT CONTRAST  CT MAXILLOFACIAL WITHOUT CONTRAST  CT CERVICAL SPINE WITHOUT CONTRAST  TECHNIQUE: Multidetector CT imaging of the head, cervical spine, and maxillofacial structures were performed using the standard protocol without intravenous contrast. Multiplanar CT image reconstructions of the cervical spine and maxillofacial structures were also generated.  COMPARISON:  None.  FINDINGS: CT HEAD FINDINGS  Diffuse prominence of the CSF containing spaces is compatible with generalized cerebral atrophy. Patchy and confluent hypodensity within the periventricular and deep white matter is most consistent with chronic small vessel ischemic disease.  There is new hypodensity involving predominantly the deep white matter of the anterior right frontal lobe, suspicious for possible vasogenic edema (series 2, image 14). No frank mass lesion identified. No other areas of vasogenic edema.  No acute large vessel territory infarct. No intracranial hemorrhage. No extra-axial fluid collection.  No midline shift identified. Ventricular prominence related to global parenchymal volume loss is similar to previous. No hydrocephalus.  Scalp contusion present at the right forehead. Scalp soft tissues are otherwise unremarkable.  Calvarium intact. Paranasal sinuses  are clear. No mastoid effusion.  CT MAXILLOFACIAL FINDINGS  Right forehead/ periorbital contusion. Intact globes. No retro-orbital pathology. Sequelae of prior lens extraction noted bilaterally. Bony orbits intact without evidence of orbital floor fracture. Lamina perforation intact. Orbital roofs intact.  Zygomatic arch is intact. No maxillary fracture. Pterygoid plates intact. Nasal bones intact. Nasal septum bowed to the left but intact.  Mandible intact. Mandibular condyles normally  situated within the temporomandibular fossa.  Paranasal sinuses are well pneumatized and free of fluid.  CT CERVICAL SPINE FINDINGS  The vertebral bodies are normally aligned with preservation of the normal cervical lordosis. Vertebral body heights are preserved. Normal C1-2 articulations are intact. No prevertebral soft tissue swelling. No acute fracture or listhesis. Moderate degenerative disc disease present at C4-5 and C5-6.  Visualized soft tissues of the neck demonstrate no acute abnormality. Interlobular septal thickening with multi focal nodular densities seen within the visualized lungs, indeterminate. Irregular biapical pleural thickening noted.  IMPRESSION: CT HEAD:  1. No definite acute intracranial process identified. 2. Scalp contusion at the right forehead. 3. New area of apparent vasogenic edema within the anterior right frontal lobe. Further evaluation with dedicated MRI, with and without contrast is recommended. 4. Atrophy with chronic microvascular ischemic disease.  CT MAXILLOFACIAL:  1. Right forehead/periorbital contusion. 2. No other acute maxillofacial injury identified.  CT CERVICAL SPINE:  1. No CT evidence for acute traumatic injury within the cervical spine. 2. Scattered nodular densities with interlobular septal thickening within the visualized lung apices. Findings are indeterminate, but may reflect sequela of underlying infection and/or edema. Correlation with dedicated chest radiograph recommended.    Electronically Signed   By: Jeannine Boga M.D.   On: 11/25/2014 04:42   Ct Maxillofacial Wo Cm  11/25/2014   CLINICAL DATA:  Initial evaluation for acute trauma.  EXAM: CT HEAD WITHOUT CONTRAST  CT MAXILLOFACIAL WITHOUT CONTRAST  CT CERVICAL SPINE WITHOUT CONTRAST  TECHNIQUE: Multidetector CT imaging of the head, cervical spine, and maxillofacial structures were performed using the standard protocol without intravenous contrast. Multiplanar CT image reconstructions of the cervical spine and maxillofacial structures were also generated.  COMPARISON:  None.  FINDINGS: CT HEAD FINDINGS  Diffuse prominence of the CSF containing spaces is compatible with generalized cerebral atrophy. Patchy and confluent hypodensity within the periventricular and deep white matter is most consistent with chronic small vessel ischemic disease.  There is new hypodensity involving predominantly the deep white matter of the anterior right frontal lobe, suspicious for possible vasogenic edema (series 2, image 14). No frank mass lesion identified. No other areas of vasogenic edema.  No acute large vessel territory infarct. No intracranial hemorrhage. No extra-axial fluid collection.  No midline shift identified. Ventricular prominence related to global parenchymal volume loss is similar to previous. No hydrocephalus.  Scalp contusion present at the right forehead. Scalp soft tissues are otherwise unremarkable.  Calvarium intact. Paranasal sinuses are clear. No mastoid effusion.  CT MAXILLOFACIAL FINDINGS  Right forehead/ periorbital contusion. Intact globes. No retro-orbital pathology. Sequelae of prior lens extraction noted bilaterally. Bony orbits intact without evidence of orbital floor fracture. Lamina perforation intact. Orbital roofs intact.  Zygomatic arch is intact. No maxillary fracture. Pterygoid plates intact. Nasal bones intact. Nasal septum bowed to the left but intact.  Mandible intact. Mandibular condyles normally  situated within the temporomandibular fossa.  Paranasal sinuses are well pneumatized and free of fluid.  CT CERVICAL SPINE FINDINGS  The vertebral bodies are normally aligned with preservation of the normal cervical lordosis. Vertebral body heights are preserved. Normal C1-2 articulations are intact. No prevertebral soft tissue swelling. No acute fracture or listhesis. Moderate degenerative disc disease present at C4-5 and C5-6.  Visualized soft tissues of the neck demonstrate no acute abnormality. Interlobular septal thickening with multi focal nodular densities seen within the visualized lungs, indeterminate. Irregular biapical pleural thickening noted.  IMPRESSION: CT HEAD:  1. No definite acute intracranial process  identified. 2. Scalp contusion at the right forehead. 3. New area of apparent vasogenic edema within the anterior right frontal lobe. Further evaluation with dedicated MRI, with and without contrast is recommended. 4. Atrophy with chronic microvascular ischemic disease.  CT MAXILLOFACIAL:  1. Right forehead/periorbital contusion. 2. No other acute maxillofacial injury identified.  CT CERVICAL SPINE:  1. No CT evidence for acute traumatic injury within the cervical spine. 2. Scattered nodular densities with interlobular septal thickening within the visualized lung apices. Findings are indeterminate, but may reflect sequela of underlying infection and/or edema. Correlation with dedicated chest radiograph recommended.   Electronically Signed   By: Jeannine Boga M.D.   On: 11/25/2014 04:42    Review of Systems  Constitutional: Negative for fever and chills.  HENT: Negative for sore throat and tinnitus.   Eyes: Negative for blurred vision and redness.  Respiratory: Negative for cough and shortness of breath.   Cardiovascular: Negative for chest pain, palpitations, orthopnea and PND.  Gastrointestinal: Negative for nausea, vomiting, abdominal pain and diarrhea.  Genitourinary: Negative for  dysuria, urgency and frequency.  Musculoskeletal: Negative for myalgias and joint pain.  Skin: Negative for rash.       No lesions  Neurological: Negative for speech change, focal weakness and weakness.  Endo/Heme/Allergies: Does not bruise/bleed easily.       No temperature intolerance  Psychiatric/Behavioral: Negative for depression and suicidal ideas.    Blood pressure 147/72, pulse 58, temperature 99.6 F (37.6 C), temperature source Oral, resp. rate 27, SpO2 92 %. Physical Exam  Vitals reviewed. Constitutional: She is oriented to person, place, and time. She appears well-developed and well-nourished.  HENT:  Head: Normocephalic.  Mouth/Throat: Oropharynx is clear and moist.  Hematoma inferior to right eye along orbit and zygomatic arch; contusion to right forehead  Eyes: Conjunctivae and EOM are normal. Pupils are equal, round, and reactive to light. No scleral icterus.  Neck: Normal range of motion. Neck supple. No JVD present. No tracheal deviation present. No thyromegaly present.  Cardiovascular: Normal rate, regular rhythm and normal heart sounds.  Exam reveals no gallop and no friction rub.   No murmur heard. Respiratory: Effort normal and breath sounds normal.  GI: Soft. Bowel sounds are normal. She exhibits no distension. There is no tenderness.  Genitourinary:  Deferred  Musculoskeletal: Normal range of motion. She exhibits no edema.  Lymphadenopathy:    She has no cervical adenopathy.  Neurological: She is alert and oriented to person, place, and time. No cranial nerve deficit. She exhibits normal muscle tone.  Skin: Skin is warm and dry. No rash noted. No erythema.  Psychiatric: She has a normal mood and affect. Her behavior is normal. Thought content normal.  Difficult to assess judgment is the patient is reticent and has poor memory     Assessment/Plan This is an 81 year old Caucasian female admitted for atrial fibrillation with rapid ventricular rate and  concussion. 1. Atrial fibrillation: Rate improved after diltiazem 5 mg IV, however heart rate intermittently exceeds 120. I have ordered another dose of diltiazem 5 mg IV and we will monitor the patient's rhythm in the ICU in case infusion of antiarrhythmic medication needs to be started. CHAD2 score is 2; CHADVASc score is 4 indicating moderate to high risk for embolic event. I have consulted cardiology and neurology to help weigh the risks and benefits of anticoagulation in this patient who is a falls risk. Elevated troponin likely secondary to prolonged tachycardia. Continue to trend enzymes. No evidence of ischemia  on EKG. 2. Concussion: Vasogenic edema in the region of skull contusion. No acute bleeding or stroke on CT. 3. Dementia: Reportedly Alzheimer's type. I have placed a sitter as needed by the patient's bedside. 4. Chronic kidney disease: Stage IV. Avoid nephrotoxic drugs 5. DVT prophylaxis: Heparin only at this time 6. GI prophylaxis: None The patient is a full code. Time spent on admission orders and patient care approximately 35 minutes  Harrie Foreman 11/25/2014, 7:49 AM

## 2014-11-25 NOTE — Progress Notes (Signed)
Dr. Katrinka BlazingSmith (neurology) ordered anti-coagulation (heparin) held at this time.

## 2014-11-25 NOTE — ED Notes (Signed)
Dr. Diamond in to see pt.  

## 2014-11-25 NOTE — Progress Notes (Signed)
Alert but confused. Picks at monitoring equipment and frequently takes o2 out of nose. Sitter at bedside. Denies pain. Afebrile.  Heart rate remains Sinus tach 1 teens and 120's with frequent PACs. Dr. Gwen PoundsKowalski saw patient this evening and increased metoprolol dose. Son has called and been updated.  Alicia Cohen

## 2014-11-25 NOTE — ED Notes (Signed)
pt resting quietly on stretcher in darkened exam room; eyes closed, resp even/unlab, no distress noted; will continue to monitor 

## 2014-11-26 ENCOUNTER — Inpatient Hospital Stay: Payer: Medicare Other

## 2014-11-26 LAB — CBC
HCT: 34.4 % — ABNORMAL LOW (ref 35.0–47.0)
Hemoglobin: 11.2 g/dL — ABNORMAL LOW (ref 12.0–16.0)
MCH: 27 pg (ref 26.0–34.0)
MCHC: 32.6 g/dL (ref 32.0–36.0)
MCV: 82.7 fL (ref 80.0–100.0)
PLATELETS: 225 10*3/uL (ref 150–440)
RBC: 4.16 MIL/uL (ref 3.80–5.20)
RDW: 16.6 % — AB (ref 11.5–14.5)
WBC: 18.9 10*3/uL — ABNORMAL HIGH (ref 3.6–11.0)

## 2014-11-26 LAB — BASIC METABOLIC PANEL
Anion gap: 10 (ref 5–15)
BUN: 23 mg/dL — AB (ref 6–20)
CALCIUM: 8.4 mg/dL — AB (ref 8.9–10.3)
CO2: 24 mmol/L (ref 22–32)
Chloride: 102 mmol/L (ref 101–111)
Creatinine, Ser: 1.45 mg/dL — ABNORMAL HIGH (ref 0.44–1.00)
GFR calc non Af Amer: 33 mL/min — ABNORMAL LOW (ref >60)
GFR, EST AFRICAN AMERICAN: 38 mL/min — AB (ref >60)
GLUCOSE: 91 mg/dL (ref 65–99)
Potassium: 3.7 mmol/L (ref 3.5–5.1)
Sodium: 136 mmol/L (ref 135–145)

## 2014-11-26 LAB — HEMOGLOBIN A1C: Hgb A1c MFr Bld: 5.6 % (ref 4.0–6.0)

## 2014-11-26 MED ORDER — PNEUMOCOCCAL VAC POLYVALENT 25 MCG/0.5ML IJ INJ
0.5000 mL | INJECTION | INTRAMUSCULAR | Status: AC
  Administered 2014-11-27: 0.5 mL via INTRAMUSCULAR
  Filled 2014-11-26: qty 0.5

## 2014-11-26 MED ORDER — DILTIAZEM HCL 25 MG/5ML IV SOLN
10.0000 mg | INTRAVENOUS | Status: AC
  Administered 2014-11-26: 10 mg via INTRAVENOUS

## 2014-11-26 MED ORDER — VANCOMYCIN HCL IN DEXTROSE 750-5 MG/150ML-% IV SOLN
750.0000 mg | Freq: Once | INTRAVENOUS | Status: AC
  Administered 2014-11-26: 750 mg via INTRAVENOUS
  Filled 2014-11-26: qty 150

## 2014-11-26 MED ORDER — DILTIAZEM HCL 25 MG/5ML IV SOLN
5.0000 mg | INTRAVENOUS | Status: AC
  Administered 2014-11-26: 5 mg via INTRAVENOUS

## 2014-11-26 MED ORDER — DILTIAZEM HCL 25 MG/5ML IV SOLN
INTRAVENOUS | Status: AC
  Filled 2014-11-26: qty 5

## 2014-11-26 MED ORDER — PIPERACILLIN-TAZOBACTAM 3.375 G IVPB
3.3750 g | Freq: Three times a day (TID) | INTRAVENOUS | Status: DC
  Administered 2014-11-26 – 2014-11-28 (×6): 3.375 g via INTRAVENOUS
  Filled 2014-11-26 (×10): qty 50

## 2014-11-26 MED ORDER — VANCOMYCIN HCL 500 MG IV SOLR
500.0000 mg | INTRAVENOUS | Status: DC
  Filled 2014-11-26: qty 500

## 2014-11-26 MED ORDER — METOPROLOL TARTRATE 50 MG PO TABS
50.0000 mg | ORAL_TABLET | Freq: Once | ORAL | Status: AC
  Administered 2014-11-26: 50 mg via ORAL

## 2014-11-26 MED ORDER — DILTIAZEM HCL ER COATED BEADS 240 MG PO CP24
240.0000 mg | ORAL_CAPSULE | Freq: Every day | ORAL | Status: DC
  Administered 2014-11-26 – 2014-11-30 (×5): 240 mg via ORAL
  Filled 2014-11-26 (×5): qty 1

## 2014-11-26 NOTE — Progress Notes (Signed)
PT Cancellation Note  Patient Details Name: Alicia Cohen MRN: [id number] DOB: 1930-06-04   Cancelled Treatment:    Reason Eval/Treat Not Completed: Patient not medically ready. Apparently patient was found in the parking lot outside her memory loss unit, so it appears at baseline (and per patient) she is able to ambulate with RW for appreciable distances. PT attempted to see today, however with minimal activity (5 straight leg raises each side) her HR increased to 160. Patient has had HRs bouncing from 117-140s resting in the bed without any activity. She was able to follow commands ("lift your leg 5 times", she lifted her leg 5 times without verbal cuing) during the limited encounter. Given her elevated HR and subsequent increases with activity PT will defer mobility evaluation at this time. PT will continue to re-attempt as tolerated and medically appropriate.   Kerin Ransom, PT, DPT    11/26/2014, 8:42 AM

## 2014-11-26 NOTE — Progress Notes (Signed)
[facility] - [place] at [facility]   PATIENT NAME: Alicia Cohen    MR#:  [id number]  DATE OF BIRTH:  [birth year]  SUBJECTIVE:  CHIEF COMPLAINT:   Chief Complaint  Patient presents with  . Fall  . Atrial Fibrillation   More alert today. No complaints. Oriented to place and person.  REVIEW OF SYSTEMS:   Review of Systems  Constitutional: Negative for fever.  Respiratory: Negative for shortness of breath.   Cardiovascular: Negative for chest pain and palpitations.  Gastrointestinal: Negative for nausea, vomiting and abdominal pain.  Genitourinary: Negative for dysuria.    DRUG ALLERGIES:  No Known Allergies  VITALS:  Blood pressure 128/54, pulse 99, temperature 98.6 F (37 C), temperature source Oral, resp. rate 29, weight 44 kg (97 lb), SpO2 94 %.  PHYSICAL EXAMINATION:  GENERAL:  80 y.o.-year-old patient lying in the bed with no acute distress. Thin, black eye, ill appearing EYES: Pupils equal, round, reactive to light and accommodation. No scleral icterus. Extraocular muscles intact.  HEENT: Swelling bruising over the right side of the forehead and right eye.  NECK:  Supple, no jugular venous distention. No thyroid enlargement, no tenderness.  LUNGS: Short shallow respirations with diffuse crackles, fair air movement CARDIOVASCULAR: Tachycardic, irregular, distant S1, S2 normal. No murmurs, rubs, or gallops.  ABDOMEN: Soft, nontender, nondistended. Bowel sounds present. No organomegaly or mass.  EXTREMITIES: No pedal edema, cyanosis, or clubbing. Muscle wasting NEUROLOGIC: Does not participate  PSYCHIATRIC: Unable to assess    LABORATORY PANEL:   CBC  Recent Labs Lab 11/26/14 0500  WBC 18.9*  HGB 11.2*  HCT 34.4*  PLT 225   ------------------------------------------------------------------------------------------------------------------  Chemistries   Recent Labs Lab 11/25/14 0220 11/26/14 0500  NA 133* 136  K 3.7 3.7  CL  96* 102  CO2 27 24  GLUCOSE 120* 91  BUN 30* 23*  CREATININE 1.51* 1.45*  CALCIUM 8.7* 8.4*  AST 27  --   ALT 21  --   ALKPHOS 115  --   BILITOT 0.4  --    ------------------------------------------------------------------------------------------------------------------  Cardiac Enzymes  Recent Labs Lab 11/25/14 0844  TROPONINI 0.03   ------------------------------------------------------------------------------------------------------------------  RADIOLOGY:  Ct Head Wo Contrast  11/25/2014   CLINICAL DATA:  Initial evaluation for acute trauma.  EXAM: CT HEAD WITHOUT CONTRAST  CT MAXILLOFACIAL WITHOUT CONTRAST  CT CERVICAL SPINE WITHOUT CONTRAST  TECHNIQUE: Multidetector CT imaging of the head, cervical spine, and maxillofacial structures were performed using the standard protocol without intravenous contrast. Multiplanar CT image reconstructions of the cervical spine and maxillofacial structures were also generated.  COMPARISON:  None.  FINDINGS: CT HEAD FINDINGS  Diffuse prominence of the CSF containing spaces is compatible with generalized cerebral atrophy. Patchy and confluent hypodensity within the periventricular and deep white matter is most consistent with chronic small vessel ischemic disease.  There is new hypodensity involving predominantly the deep white matter of the anterior right frontal lobe, suspicious for possible vasogenic edema (series 2, image 14). No frank mass lesion identified. No other areas of vasogenic edema.  No acute large vessel territory infarct. No intracranial hemorrhage. No extra-axial fluid collection.  No midline shift identified. Ventricular prominence related to global parenchymal volume loss is similar to previous. No hydrocephalus.  Scalp contusion present at the right forehead. Scalp soft tissues are otherwise unremarkable.  Calvarium intact. Paranasal sinuses are clear. No mastoid effusion.  CT MAXILLOFACIAL FINDINGS  Right forehead/ periorbital  contusion. Intact globes. No retro-orbital pathology. Sequelae of prior lens  extraction noted bilaterally. Bony orbits intact without evidence of orbital floor fracture. Lamina perforation intact. Orbital roofs intact.  Zygomatic arch is intact. No maxillary fracture. Pterygoid plates intact. Nasal bones intact. Nasal septum bowed to the left but intact.  Mandible intact. Mandibular condyles normally situated within the temporomandibular fossa.  Paranasal sinuses are well pneumatized and free of fluid.  CT CERVICAL SPINE FINDINGS  The vertebral bodies are normally aligned with preservation of the normal cervical lordosis. Vertebral body heights are preserved. Normal C1-2 articulations are intact. No prevertebral soft tissue swelling. No acute fracture or listhesis. Moderate degenerative disc disease present at C4-5 and C5-6.  Visualized soft tissues of the neck demonstrate no acute abnormality. Interlobular septal thickening with multi focal nodular densities seen within the visualized lungs, indeterminate. Irregular biapical pleural thickening noted.  IMPRESSION: CT HEAD:  1. No definite acute intracranial process identified. 2. Scalp contusion at the right forehead. 3. New area of apparent vasogenic edema within the anterior right frontal lobe. Further evaluation with dedicated MRI, with and without contrast is recommended. 4. Atrophy with chronic microvascular ischemic disease.  CT MAXILLOFACIAL:  1. Right forehead/periorbital contusion. 2. No other acute maxillofacial injury identified.  CT CERVICAL SPINE:  1. No CT evidence for acute traumatic injury within the cervical spine. 2. Scattered nodular densities with interlobular septal thickening within the visualized lung apices. Findings are indeterminate, but may reflect sequela of underlying infection and/or edema. Correlation with dedicated chest radiograph recommended.   Electronically Signed   By: Rise Mu M.D.   On: 11/25/2014 04:42   Ct  Cervical Spine Wo Contrast  11/25/2014   CLINICAL DATA:  Initial evaluation for acute trauma.  EXAM: CT HEAD WITHOUT CONTRAST  CT MAXILLOFACIAL WITHOUT CONTRAST  CT CERVICAL SPINE WITHOUT CONTRAST  TECHNIQUE: Multidetector CT imaging of the head, cervical spine, and maxillofacial structures were performed using the standard protocol without intravenous contrast. Multiplanar CT image reconstructions of the cervical spine and maxillofacial structures were also generated.  COMPARISON:  None.  FINDINGS: CT HEAD FINDINGS  Diffuse prominence of the CSF containing spaces is compatible with generalized cerebral atrophy. Patchy and confluent hypodensity within the periventricular and deep white matter is most consistent with chronic small vessel ischemic disease.  There is new hypodensity involving predominantly the deep white matter of the anterior right frontal lobe, suspicious for possible vasogenic edema (series 2, image 14). No frank mass lesion identified. No other areas of vasogenic edema.  No acute large vessel territory infarct. No intracranial hemorrhage. No extra-axial fluid collection.  No midline shift identified. Ventricular prominence related to global parenchymal volume loss is similar to previous. No hydrocephalus.  Scalp contusion present at the right forehead. Scalp soft tissues are otherwise unremarkable.  Calvarium intact. Paranasal sinuses are clear. No mastoid effusion.  CT MAXILLOFACIAL FINDINGS  Right forehead/ periorbital contusion. Intact globes. No retro-orbital pathology. Sequelae of prior lens extraction noted bilaterally. Bony orbits intact without evidence of orbital floor fracture. Lamina perforation intact. Orbital roofs intact.  Zygomatic arch is intact. No maxillary fracture. Pterygoid plates intact. Nasal bones intact. Nasal septum bowed to the left but intact.  Mandible intact. Mandibular condyles normally situated within the temporomandibular fossa.  Paranasal sinuses are well  pneumatized and free of fluid.  CT CERVICAL SPINE FINDINGS  The vertebral bodies are normally aligned with preservation of the normal cervical lordosis. Vertebral body heights are preserved. Normal C1-2 articulations are intact. No prevertebral soft tissue swelling. No acute fracture or listhesis. Moderate degenerative disc  disease present at C4-5 and C5-6.  Visualized soft tissues of the neck demonstrate no acute abnormality. Interlobular septal thickening with multi focal nodular densities seen within the visualized lungs, indeterminate. Irregular biapical pleural thickening noted.  IMPRESSION: CT HEAD:  1. No definite acute intracranial process identified. 2. Scalp contusion at the right forehead. 3. New area of apparent vasogenic edema within the anterior right frontal lobe. Further evaluation with dedicated MRI, with and without contrast is recommended. 4. Atrophy with chronic microvascular ischemic disease.  CT MAXILLOFACIAL:  1. Right forehead/periorbital contusion. 2. No other acute maxillofacial injury identified.  CT CERVICAL SPINE:  1. No CT evidence for acute traumatic injury within the cervical spine. 2. Scattered nodular densities with interlobular septal thickening within the visualized lung apices. Findings are indeterminate, but may reflect sequela of underlying infection and/or edema. Correlation with dedicated chest radiograph recommended.   Electronically Signed   By: Rise Mu M.D.   On: 11/25/2014 04:42   Mr Brain Wo Contrast  11/26/2014   CLINICAL DATA:  Dementia.  Brain mass.  EXAM: MRI HEAD WITHOUT CONTRAST  TECHNIQUE: Multiplanar, multiecho pulse sequences of the brain and surrounding structures were obtained without intravenous contrast.  COMPARISON:  Head CT from yesterday  FINDINGS: Motion degraded study  Calvarium and upper cervical spine: Right frontal scalp swelling. No focal marrow signal abnormality.  Orbits: No significant findings.  Sinuses and Mastoids: Clear.  Mastoid and middle ears are clear.  Brain:  The right frontal mass is again noted, peripheral and in the anterior aspect of the frontal lobe. Centrally the mass is T2 and T1 hyperintense with a T2 hypo intense rim, likely hemosiderin. Anteriorly, the cavity appears partially septated. FLAIR and T2 hyperintensity surrounds the presumed cavity; no discrete neighboring mass lesion. Remote intracranial hemorrhage has been present superficially in the bilateral parietal lobes, but no overt amyloid angiopathy. Cannot evaluate for enhancement in this patient with renal insufficiency and clinical decision not to administer contrast.  Chronic small-vessel disease is moderate with ischemic gliosis around the lateral ventricles predominantly.  No acute infarct. No evidence of acute hemorrhage, hydrocephalus, or major vessel occlusion.  Brain atrophy with prominent mesial temporal involvement, correlating with history of Alzheimer's disease.  IMPRESSION: 1. The right frontal mass is consistent with subacute hematoma measuring 21 mm. This could be from recent trauma, amyloid, or hemorrhagic mass. Since intravenous contrast could not be administered, recommend serial imaging (with first scan in approximately 6 to 8 weeks). 2. Brain atrophy with pattern typical of Alzheimer's disease.   Electronically Signed   By: Marnee Spring M.D.   On: 11/26/2014 12:55   Dg Chest Port 1 View  11/26/2014   CLINICAL DATA:  Fever  EXAM: PORTABLE CHEST - 1 VIEW  COMPARISON:  08/04/14  FINDINGS: Heart size normal. Stable calcification of the aortic arch. Increased interstitial opacities on the left when compared to prior study. Right mid lung opacity, geometric, again identified. This is unchanged when compared to the prior examination.  IMPRESSION: 1. Unchanged right middle lobe opacity for which CT thorax is again recommended to exclude underlying malignancy. 2. Increased interstitial opacities left lung. Atypical infection is a concern.  Asymmetric pulmonary edema considered possible but unlikely given absence of cardiac enlargement or significant vascular congestion and given the bilateral asymmetry.   Electronically Signed   By: Esperanza Heir M.D.   On: 11/26/2014 11:56   Ct Maxillofacial Wo Cm  11/25/2014   CLINICAL DATA:  Initial evaluation for acute trauma.  EXAM: CT HEAD WITHOUT CONTRAST  CT MAXILLOFACIAL WITHOUT CONTRAST  CT CERVICAL SPINE WITHOUT CONTRAST  TECHNIQUE: Multidetector CT imaging of the head, cervical spine, and maxillofacial structures were performed using the standard protocol without intravenous contrast. Multiplanar CT image reconstructions of the cervical spine and maxillofacial structures were also generated.  COMPARISON:  None.  FINDINGS: CT HEAD FINDINGS  Diffuse prominence of the CSF containing spaces is compatible with generalized cerebral atrophy. Patchy and confluent hypodensity within the periventricular and deep white matter is most consistent with chronic small vessel ischemic disease.  There is new hypodensity involving predominantly the deep white matter of the anterior right frontal lobe, suspicious for possible vasogenic edema (series 2, image 14). No frank mass lesion identified. No other areas of vasogenic edema.  No acute large vessel territory infarct. No intracranial hemorrhage. No extra-axial fluid collection.  No midline shift identified. Ventricular prominence related to global parenchymal volume loss is similar to previous. No hydrocephalus.  Scalp contusion present at the right forehead. Scalp soft tissues are otherwise unremarkable.  Calvarium intact. Paranasal sinuses are clear. No mastoid effusion.  CT MAXILLOFACIAL FINDINGS  Right forehead/ periorbital contusion. Intact globes. No retro-orbital pathology. Sequelae of prior lens extraction noted bilaterally. Bony orbits intact without evidence of orbital floor fracture. Lamina perforation intact. Orbital roofs intact.  Zygomatic arch is  intact. No maxillary fracture. Pterygoid plates intact. Nasal bones intact. Nasal septum bowed to the left but intact.  Mandible intact. Mandibular condyles normally situated within the temporomandibular fossa.  Paranasal sinuses are well pneumatized and free of fluid.  CT CERVICAL SPINE FINDINGS  The vertebral bodies are normally aligned with preservation of the normal cervical lordosis. Vertebral body heights are preserved. Normal C1-2 articulations are intact. No prevertebral soft tissue swelling. No acute fracture or listhesis. Moderate degenerative disc disease present at C4-5 and C5-6.  Visualized soft tissues of the neck demonstrate no acute abnormality. Interlobular septal thickening with multi focal nodular densities seen within the visualized lungs, indeterminate. Irregular biapical pleural thickening noted.  IMPRESSION: CT HEAD:  1. No definite acute intracranial process identified. 2. Scalp contusion at the right forehead. 3. New area of apparent vasogenic edema within the anterior right frontal lobe. Further evaluation with dedicated MRI, with and without contrast is recommended. 4. Atrophy with chronic microvascular ischemic disease.  CT MAXILLOFACIAL:  1. Right forehead/periorbital contusion. 2. No other acute maxillofacial injury identified.  CT CERVICAL SPINE:  1. No CT evidence for acute traumatic injury within the cervical spine. 2. Scattered nodular densities with interlobular septal thickening within the visualized lung apices. Findings are indeterminate, but may reflect sequela of underlying infection and/or edema. Correlation with dedicated chest radiograph recommended.   Electronically Signed   By: Rise Mu M.D.   On: 11/25/2014 04:42    EKG:   Orders placed or performed during the hospital encounter of 11/25/14  . ED EKG  . ED EKG  . EKG 12-Lead  . EKG 12-Lead    ASSESSMENT AND PLAN:   #1 atrial fibrillation with RVR:  - cardiology following - continue to titrate  oral meds, cardizem 240 mg daily and metoprolol 50 mg BID, goal HR 110 - tranfer to 2A - no anticoagulation due to fall risk and possibly hemorrhagic right frontal lobe mass  #2 elevated troponin: This was likely due to tachycardia, now decreased back to normal. Not ACS  #3 fall with head injury: - appreciate neurology consultation - MRI with subacute hematoma possible hemorrhagic mass/contusion - no anticoagulation  #  4 chronic kidney disease stage 4: Stable  #5 dementia: Stable. No behavioral disturbance No sitter is needed.  #6 leucocytosis: - blood cx, repeat UC - CXR with questionable opacity in LLL, start treatment for HCAP (from SNF) with vanc, zosyn  #7 clinical malnutrition - suppliment  Prophylaxis heparin for DVT prophylaxis, no GI prophylaxis  CODE STATUS: Full  TOTAL TIME TAKING CARE OF THIS PATIENT: 40 minutes.  Greater than 50% of time spent in care coordination and counseling. All the records are reviewed and case discussed with Care Management/Social Worker. Management plans discussed with the patient, family and they are in agreement. Spoke with her husband and caregiver at length.  POSSIBLE D/C IN 2-3 DAYS, DEPENDING ON CLINICAL CONDITION.   Elby Showers M.D on 11/26/2014 at 2:34 PM  Between 7am to 6pm - Pager - [phone]  After 6pm go to [email or web address] - password [email or web address]  [place] [facility]  Office  [phone]  CC: Primary care physician; Jerl Mina, MD

## 2014-11-26 NOTE — Progress Notes (Signed)
Patient transferred to 2A bed and 2A tely monitor that was verified with Montez Morita, tely clerk.  Patient being monitored on teley in ICU while in MRI until this RN calls report to 2A nurse. Patient sent to MRI at this time, by bed on o2 and tely monitor with [facility], orderly.

## 2014-11-26 NOTE — Progress Notes (Signed)
Pt tx from CCU. Alert to self only. Sleepy. NS @ 80. Shows no signs of pain. Family at the bedside. Incontinent. BM 7/21. MRI showed R frontal mass. No skin issues. Pt has no further concerns at this time.

## 2014-11-26 NOTE — Progress Notes (Signed)
ANTIBIOTIC CONSULT NOTE - INITIAL  Pharmacy Consult for Vancomycin and Zosyn Indication: leukocytosis   No Known Allergies  Patient Measurements: Weight: 97 lb (44 kg) Adjusted Body Weight:   Vital Signs: Temp: 98.6 F (37 C) (07/21 1254) Temp Source: Oral (07/21 1254) BP: 128/54 mmHg (07/21 1254) Pulse Rate: 99 (07/21 1254) Intake/Output from previous day: 07/20 0701 - 07/21 0700 In: 1659.3 [P.O.:150; I.V.:1509.3] Out: -  Intake/Output from this shift: Total I/O In: 500 [P.O.:100; I.V.:400] Out: -   Labs:  Recent Labs  11/25/14 0220 11/26/14 0500  WBC 16.3* 18.9*  HGB 12.0 11.2*  PLT 190 225  CREATININE 1.51* 1.45*   Estimated Creatinine Clearance: 20.8 mL/min (by C-G formula based on Cr of 1.45). No results for input(s): VANCOTROUGH, VANCOPEAK, VANCORANDOM, GENTTROUGH, GENTPEAK, GENTRANDOM, TOBRATROUGH, TOBRAPEAK, TOBRARND, AMIKACINPEAK, AMIKACINTROU, AMIKACIN in the last 72 hours.   Microbiology: Recent Results (from the past 720 hour(s))  MRSA PCR Screening     Status: None   Collection Time: 11/25/14  1:55 AM  Result Value Ref Range Status   MRSA by PCR NEGATIVE NEGATIVE Final    Comment:        The GeneXpert MRSA Assay (FDA approved for NASAL specimens only), is one component of a comprehensive MRSA colonization surveillance program. It is not intended to diagnose MRSA infection nor to guide or monitor treatment for MRSA infections.     Medical History: Past Medical History  Diagnosis Date  . Hyperlipidemia   . Dementia   . Alzheimer disease   . Hypertension   . GERD (gastroesophageal reflux disease)   . Osteoporosis   . Kidney disease, chronic, stage III (moderate, EGFR 30-59 ml/min)   . DNR (do not resuscitate)     Medications:  Scheduled:  . antiseptic oral rinse  7 mL Mouth Rinse BID  . diltiazem  240 mg Oral Daily  . docusate sodium  100 mg Oral BID  . loratadine  10 mg Oral Daily  . metoprolol tartrate  50 mg Oral BID  .  multivitamin with minerals  1 tablet Oral Daily  . pantoprazole  40 mg Oral Daily  . piperacillin-tazobactam (ZOSYN)  IV  3.375 g Intravenous 3 times per day  . [START ON 11/27/2014] pneumococcal 23 valent vaccine  0.5 mL Intramuscular Tomorrow-1000  . rivastigmine  9.5 mg Transdermal Daily  . [START ON 11/27/2014] vancomycin  500 mg Intravenous Q36H  . vancomycin  750 mg Intravenous Once   Assessment: Patient being treated empirically for leukocytosis.  PK parameters: Kel (hr-1): 0.021 Half-life (hrs): 33.01 Vd (liters): 30.80 (factor used: 0.7 L/kg)  Goal of Therapy:  Vancomycin trough level 15-20 mcg/ml  Plan:  Follow up culture results   Vancomycin: Will give Vancomycin 750 mg IV x 1 then will start Vancomycin 500 mg IV q36 hours. Will order Vancomycin trough level prior to the 7/25 '@1430' .  Zosyn: Will start Vancomycin 3.375 g IV q8 hours. Renal function is borderline for q12 hour dosing.    Alexyia Guarino D 11/26/2014,3:10 PM

## 2014-11-26 NOTE — Progress Notes (Addendum)
RN called Cardiologist on call (Dr. Darrold Junker) about patient's blood pressure with MAP in low 60s (SBP still greater than 90), patient converted to normal sinus rhythm, cardizem drip ordered but running only at 5 mL/hr, and metoprolol dose due. MD ordered RN to go ahead and give metoprolol dose. No other orders given.

## 2014-11-26 NOTE — Progress Notes (Signed)
Dr. Clent Ridges present and RN made MD aware that patient had incontinent void that had a pink tinge to it. MD acknowledged and stated "i will order another UA and she doesn't need a sitter anymore, I will discontinue it."

## 2014-11-26 NOTE — Progress Notes (Signed)
Report called to Leslie Dales, RN on 2A. Leslie Dales, RN aware that patient is in MRI with 2A tely monitor on that has been monitored by ICU tely clerks.  Liborio Nixon, tely clerk spoke with Dois Davenport, tely clerk on 2A and 2A is now monitoring patient's teley monitor at this time since report has been given by ICU nurse.

## 2014-11-26 NOTE — Progress Notes (Addendum)
Tx to ICU 8. Family was updated.  IV cardizem was given. Incontinent. 6 L of oxygen. A fib 170 MD Clent Ridges was made aware. Tx to CCU for cardizem drip. Report called to [facility] RN

## 2014-11-26 NOTE — Progress Notes (Signed)
[facility] Cardiology [facility] Encounter Note  Patient: Alicia Cohen / Admit Date: 11/25/2014 / Date of Encounter: 11/26/2014, 8:18 AM   Subjective: No significant new symptoms. Patient still with tachycardia  Review of Systems: Positive for: None Negative for: Vision change, hearing change, syncope, dizziness, nausea, vomiting,diarrhea, bloody stool, stomach pain, cough, congestion, diaphoresis, urinary frequency, urinary pain,skin lesions, skin rashes Others previously listed  Objective: Telemetry: Atrial tachycardia with rapid rate and atrial contractions Physical Exam: Blood pressure 124/53, pulse 108, temperature 100 F (37.8 C), temperature source Oral, resp. rate 24, weight 97 lb (44 kg), SpO2 93 %. Body mass index is 18.94 kg/(m^2). General: Well developed, well nourished, in no acute distress. Head: Normocephalic, atraumatic, sclera non-icteric, no xanthomas, nares are without discharge. Neck: No apparent masses Lungs: Normal respirations with few use wheezes, no rhonchi, no rales , few crackles   Heart: Regular rate and rhythm, normal S1 S2, no murmur, no rub, no gallop, PMI is normal size and placement, carotid upstroke normal without bruit, jugular venous pressure normal Abdomen: Soft, non-tender, non-distended with normoactive bowel sounds. No hepatosplenomegaly. Abdominal aorta is normal size without bruit Extremities: Trace edema, no clubbing, no cyanosis, no ulcers,  Peripheral: 2+ radial, 2+ femoral, 2+ dorsal pedal pulses Neuro: Alert and oriented. Moves all extremities spontaneously. Psych:  Responds to questions appropriately with a normal affect.   Intake/Output Summary (Last 24 hours) at 11/26/14 0818 Last data filed at 11/26/14 0800  Gross per 24 hour  Intake 1739.33 ml  Output      0 ml  Net 1739.33 ml    Inpatient Medications:  . antiseptic oral rinse  7 mL Mouth Rinse BID  . docusate sodium  100 mg Oral BID  . loratadine  10 mg Oral Daily  .  LORazepam  1 mg Intravenous Once  . metoprolol tartrate  50 mg Oral BID  . multivitamin with minerals  1 tablet Oral Daily  . pantoprazole  40 mg Oral Daily  . rivastigmine  9.5 mg Transdermal Daily   Infusions:  . sodium chloride 80 mL/hr at 11/26/14 0228  . diltiazem (CARDIZEM) infusion Stopped (11/25/14 1900)    Labs:  Recent Labs  11/25/14 0220 11/26/14 0500  NA 133* 136  K 3.7 3.7  CL 96* 102  CO2 27 24  GLUCOSE 120* 91  BUN 30* 23*  CREATININE 1.51* 1.45*  CALCIUM 8.7* 8.4*    Recent Labs  11/25/14 0220  AST 27  ALT 21  ALKPHOS 115  BILITOT 0.4  PROT 7.0  ALBUMIN 2.7*    Recent Labs  11/25/14 0220 11/26/14 0500  WBC 16.3* 18.9*  HGB 12.0 11.2*  HCT 36.9 34.4*  MCV 82.9 82.7  PLT 190 225    Recent Labs  11/25/14 0220 11/25/14 0844  CKTOTAL 33*  --   TROPONINI 0.14* 0.03   Invalid input(s): POCBNP  Recent Labs  11/25/14 0220  HGBA1C 5.6     Weights: Filed Weights   11/25/14 0929 11/26/14 0513  Weight: 96 lb 12.5 oz (43.9 kg) 97 lb (44 kg)     Radiology/Studies:  Ct Head Wo Contrast  11/25/2014   CLINICAL DATA:  Initial evaluation for acute trauma.  EXAM: CT HEAD WITHOUT CONTRAST  CT MAXILLOFACIAL WITHOUT CONTRAST  CT CERVICAL SPINE WITHOUT CONTRAST  TECHNIQUE: Multidetector CT imaging of the head, cervical spine, and maxillofacial structures were performed using the standard protocol without intravenous contrast. Multiplanar CT image reconstructions of the cervical spine and maxillofacial structures were  also generated.  COMPARISON:  None.  FINDINGS: CT HEAD FINDINGS  Diffuse prominence of the CSF containing spaces is compatible with generalized cerebral atrophy. Patchy and confluent hypodensity within the periventricular and deep white matter is most consistent with chronic small vessel ischemic disease.  There is new hypodensity involving predominantly the deep white matter of the anterior right frontal lobe, suspicious for possible  vasogenic edema (series 2, image 14). No frank mass lesion identified. No other areas of vasogenic edema.  No acute large vessel territory infarct. No intracranial hemorrhage. No extra-axial fluid collection.  No midline shift identified. Ventricular prominence related to global parenchymal volume loss is similar to previous. No hydrocephalus.  Scalp contusion present at the right forehead. Scalp soft tissues are otherwise unremarkable.  Calvarium intact. Paranasal sinuses are clear. No mastoid effusion.  CT MAXILLOFACIAL FINDINGS  Right forehead/ periorbital contusion. Intact globes. No retro-orbital pathology. Sequelae of prior lens extraction noted bilaterally. Bony orbits intact without evidence of orbital floor fracture. Lamina perforation intact. Orbital roofs intact.  Zygomatic arch is intact. No maxillary fracture. Pterygoid plates intact. Nasal bones intact. Nasal septum bowed to the left but intact.  Mandible intact. Mandibular condyles normally situated within the temporomandibular fossa.  Paranasal sinuses are well pneumatized and free of fluid.  CT CERVICAL SPINE FINDINGS  The vertebral bodies are normally aligned with preservation of the normal cervical lordosis. Vertebral body heights are preserved. Normal C1-2 articulations are intact. No prevertebral soft tissue swelling. No acute fracture or listhesis. Moderate degenerative disc disease present at C4-5 and C5-6.  Visualized soft tissues of the neck demonstrate no acute abnormality. Interlobular septal thickening with multi focal nodular densities seen within the visualized lungs, indeterminate. Irregular biapical pleural thickening noted.  IMPRESSION: CT HEAD:  1. No definite acute intracranial process identified. 2. Scalp contusion at the right forehead. 3. New area of apparent vasogenic edema within the anterior right frontal lobe. Further evaluation with dedicated MRI, with and without contrast is recommended. 4. Atrophy with chronic  microvascular ischemic disease.  CT MAXILLOFACIAL:  1. Right forehead/periorbital contusion. 2. No other acute maxillofacial injury identified.  CT CERVICAL SPINE:  1. No CT evidence for acute traumatic injury within the cervical spine. 2. Scattered nodular densities with interlobular septal thickening within the visualized lung apices. Findings are indeterminate, but may reflect sequela of underlying infection and/or edema. Correlation with dedicated chest radiograph recommended.   Electronically Signed   By: Rise Mu M.D.   On: 11/25/2014 04:42   Ct Cervical Spine Wo Contrast  11/25/2014   CLINICAL DATA:  Initial evaluation for acute trauma.  EXAM: CT HEAD WITHOUT CONTRAST  CT MAXILLOFACIAL WITHOUT CONTRAST  CT CERVICAL SPINE WITHOUT CONTRAST  TECHNIQUE: Multidetector CT imaging of the head, cervical spine, and maxillofacial structures were performed using the standard protocol without intravenous contrast. Multiplanar CT image reconstructions of the cervical spine and maxillofacial structures were also generated.  COMPARISON:  None.  FINDINGS: CT HEAD FINDINGS  Diffuse prominence of the CSF containing spaces is compatible with generalized cerebral atrophy. Patchy and confluent hypodensity within the periventricular and deep white matter is most consistent with chronic small vessel ischemic disease.  There is new hypodensity involving predominantly the deep white matter of the anterior right frontal lobe, suspicious for possible vasogenic edema (series 2, image 14). No frank mass lesion identified. No other areas of vasogenic edema.  No acute large vessel territory infarct. No intracranial hemorrhage. No extra-axial fluid collection.  No midline shift identified. Ventricular prominence  related to global parenchymal volume loss is similar to previous. No hydrocephalus.  Scalp contusion present at the right forehead. Scalp soft tissues are otherwise unremarkable.  Calvarium intact. Paranasal sinuses  are clear. No mastoid effusion.  CT MAXILLOFACIAL FINDINGS  Right forehead/ periorbital contusion. Intact globes. No retro-orbital pathology. Sequelae of prior lens extraction noted bilaterally. Bony orbits intact without evidence of orbital floor fracture. Lamina perforation intact. Orbital roofs intact.  Zygomatic arch is intact. No maxillary fracture. Pterygoid plates intact. Nasal bones intact. Nasal septum bowed to the left but intact.  Mandible intact. Mandibular condyles normally situated within the temporomandibular fossa.  Paranasal sinuses are well pneumatized and free of fluid.  CT CERVICAL SPINE FINDINGS  The vertebral bodies are normally aligned with preservation of the normal cervical lordosis. Vertebral body heights are preserved. Normal C1-2 articulations are intact. No prevertebral soft tissue swelling. No acute fracture or listhesis. Moderate degenerative disc disease present at C4-5 and C5-6.  Visualized soft tissues of the neck demonstrate no acute abnormality. Interlobular septal thickening with multi focal nodular densities seen within the visualized lungs, indeterminate. Irregular biapical pleural thickening noted.  IMPRESSION: CT HEAD:  1. No definite acute intracranial process identified. 2. Scalp contusion at the right forehead. 3. New area of apparent vasogenic edema within the anterior right frontal lobe. Further evaluation with dedicated MRI, with and without contrast is recommended. 4. Atrophy with chronic microvascular ischemic disease.  CT MAXILLOFACIAL:  1. Right forehead/periorbital contusion. 2. No other acute maxillofacial injury identified.  CT CERVICAL SPINE:  1. No CT evidence for acute traumatic injury within the cervical spine. 2. Scattered nodular densities with interlobular septal thickening within the visualized lung apices. Findings are indeterminate, but may reflect sequela of underlying infection and/or edema. Correlation with dedicated chest radiograph recommended.    Electronically Signed   By: Rise Mu M.D.   On: 11/25/2014 04:42   Ct Maxillofacial Wo Cm  11/25/2014   CLINICAL DATA:  Initial evaluation for acute trauma.  EXAM: CT HEAD WITHOUT CONTRAST  CT MAXILLOFACIAL WITHOUT CONTRAST  CT CERVICAL SPINE WITHOUT CONTRAST  TECHNIQUE: Multidetector CT imaging of the head, cervical spine, and maxillofacial structures were performed using the standard protocol without intravenous contrast. Multiplanar CT image reconstructions of the cervical spine and maxillofacial structures were also generated.  COMPARISON:  None.  FINDINGS: CT HEAD FINDINGS  Diffuse prominence of the CSF containing spaces is compatible with generalized cerebral atrophy. Patchy and confluent hypodensity within the periventricular and deep white matter is most consistent with chronic small vessel ischemic disease.  There is new hypodensity involving predominantly the deep white matter of the anterior right frontal lobe, suspicious for possible vasogenic edema (series 2, image 14). No frank mass lesion identified. No other areas of vasogenic edema.  No acute large vessel territory infarct. No intracranial hemorrhage. No extra-axial fluid collection.  No midline shift identified. Ventricular prominence related to global parenchymal volume loss is similar to previous. No hydrocephalus.  Scalp contusion present at the right forehead. Scalp soft tissues are otherwise unremarkable.  Calvarium intact. Paranasal sinuses are clear. No mastoid effusion.  CT MAXILLOFACIAL FINDINGS  Right forehead/ periorbital contusion. Intact globes. No retro-orbital pathology. Sequelae of prior lens extraction noted bilaterally. Bony orbits intact without evidence of orbital floor fracture. Lamina perforation intact. Orbital roofs intact.  Zygomatic arch is intact. No maxillary fracture. Pterygoid plates intact. Nasal bones intact. Nasal septum bowed to the left but intact.  Mandible intact. Mandibular condyles normally  situated within the  temporomandibular fossa.  Paranasal sinuses are well pneumatized and free of fluid.  CT CERVICAL SPINE FINDINGS  The vertebral bodies are normally aligned with preservation of the normal cervical lordosis. Vertebral body heights are preserved. Normal C1-2 articulations are intact. No prevertebral soft tissue swelling. No acute fracture or listhesis. Moderate degenerative disc disease present at C4-5 and C5-6.  Visualized soft tissues of the neck demonstrate no acute abnormality. Interlobular septal thickening with multi focal nodular densities seen within the visualized lungs, indeterminate. Irregular biapical pleural thickening noted.  IMPRESSION: CT HEAD:  1. No definite acute intracranial process identified. 2. Scalp contusion at the right forehead. 3. New area of apparent vasogenic edema within the anterior right frontal lobe. Further evaluation with dedicated MRI, with and without contrast is recommended. 4. Atrophy with chronic microvascular ischemic disease.  CT MAXILLOFACIAL:  1. Right forehead/periorbital contusion. 2. No other acute maxillofacial injury identified.  CT CERVICAL SPINE:  1. No CT evidence for acute traumatic injury within the cervical spine. 2. Scattered nodular densities with interlobular septal thickening within the visualized lung apices. Findings are indeterminate, but may reflect sequela of underlying infection and/or edema. Correlation with dedicated chest radiograph recommended.   Electronically Signed   By: Rise Mu M.D.   On: 11/25/2014 04:42     Assessment and Recommendation  81 y.o. female with essential hypertension chronic kidney disease stage II with the atrial fibrillation now having supraventricular tachycardia and/or atrial tachycardia with pre-atrial contractions and elevated troponin consistent with demand ischemia 1. Continue metoprolol diltiazem combination medication management and increased dose for better heart rate control with a  goal heart rate below 110 bpm 2. No further cardiac intervention of elevated troponin consistent with demand ischemia 3. Echocardiogram for LV systolic dysfunction valvular heart disease contributing to above 4. Relation and further adjustments of medication management and other treatments including infection  Signed, Arnoldo Hooker M.D. FACC

## 2014-11-26 NOTE — Progress Notes (Signed)
Skin looked at by Donivan Scull RN

## 2014-11-26 NOTE — Progress Notes (Signed)
Called for a fib RVR rate 160, 5 mg IV cardizem given with no improvement. Called again with a fib RVR HR 180, 10 mg IV cardizem ordered. Transfer to SDU and start cardizem gtt as ordered goal HR 90-100.

## 2014-11-26 NOTE — Progress Notes (Signed)
Dr Laurena Bering called regarding continued AFIB RVR 150 -194/in.  Cardizem gtt at /hr.  Iv with good blood return.  Pt was given additional  metoprolol po crushed in applesauce.  She was able to swallow pills in applesuace without difficulty.

## 2014-11-26 NOTE — Progress Notes (Signed)
This RN spoke with patient's son, Violett Hobbs who is aware that his mom was moved to room 251 on telemetry unit.

## 2014-11-26 NOTE — Progress Notes (Signed)
RN spoke with Dr. Gwen Pounds on the phone and MD gave order for patient to be moved to 2A and diltiazem CD  daily. RN also spoke with Dr. Clent Ridges and made her aware that Dr. Gwen Pounds is okay with patient being moved to 2A. Dr. Clent Ridges gave order for patient to be moved to 2A.

## 2014-11-26 NOTE — Clinical Social Work Note (Signed)
Clinical Social Work Assessment  Patient Details  Name: Alicia Cohen MRN: [id number] Date of Birth: [birth year]  Date of referral:  11/26/14               Reason for consult:  Facility Placement (pt from [facility] memory care unit)                Permission sought to share information with:   (pt was unable to participate in assessment.) Permission granted to share information::   (pt was unable to participate in assessment.)  Name::        Agency::     Relationship::     Contact Information:     Housing/Transportation Living arrangements for the past 2 months:  Assisted Living Facility Source of Information:  Spouse Patient Interpreter Needed:  None Criminal Activity/Legal Involvement Pertinent to Current Situation/Hospitalization:  No - Comment as needed Significant Relationships:  Spouse Lives with:  Facility Resident Do you feel safe going back to the place where you live?    Need for family participation in patient care:  Yes (Comment)  Care giving concerns:  No concerns were voiced by pt's husband at this time   Office manager / plan:  CSW spoke to pt's husband in the room.  Iantha Fallen [phone]).  He confirmed that pt was from memory care at [facility].  CSW will contact facility to confirm this as well.  Pt's husband it in favor or return to [facility] once pt is medically stable to do so.   Employment status:  Disabled (Comment on whether or not currently receiving Disability), Retired Health and safety inspector:  Medicare PT Recommendations:   (Assisted Living.) Information / Referral to community resources:     Patient/Family's Response to care:  Pt's husband it in favor or return to [facility] once pt is medically stable to do so.   Patient/Family's Understanding of and Emotional Response to Diagnosis, Current Treatment, and Prognosis:  Pt's husband it in favor or return to [facility] once pt is medically stable to do so.   Emotional  Assessment Appearance:  Appears stated age (pt did have a bruised eye on the right side.  Per pt's husband this is from a fall) Attitude/Demeanor/Rapport:    Affect (typically observed):    Orientation:    Alcohol / Substance use:  Never Used Psych involvement (Current and /or in the community):  No (Comment)  Discharge Needs  Concerns to be addressed:  Care Coordination Readmission within the last 30 days:  No Current discharge risk:  None Barriers to Discharge:  No Barriers Identified   Chauncy Passy, LCSW 11/26/2014, 3:07 PM

## 2014-11-26 NOTE — Plan of Care (Signed)
Problem: Phase I Progression Outcomes Goal: Anticoagulation Therapy per MD order Outcome: Not Met (add Reason) Heparin discontinued per Dr. Thompson Caul order.

## 2014-11-27 DIAGNOSIS — I4891 Unspecified atrial fibrillation: Secondary | ICD-10-CM | POA: Diagnosis not present

## 2014-11-27 LAB — BASIC METABOLIC PANEL
ANION GAP: 8 (ref 5–15)
BUN: 25 mg/dL — ABNORMAL HIGH (ref 6–20)
CALCIUM: 7.9 mg/dL — AB (ref 8.9–10.3)
CO2: 23 mmol/L (ref 22–32)
Chloride: 106 mmol/L (ref 101–111)
Creatinine, Ser: 1.49 mg/dL — ABNORMAL HIGH (ref 0.44–1.00)
GFR calc Af Amer: 37 mL/min — ABNORMAL LOW (ref >60)
GFR calc non Af Amer: 32 mL/min — ABNORMAL LOW (ref >60)
Glucose, Bld: 127 mg/dL — ABNORMAL HIGH (ref 65–99)
Potassium: 3.4 mmol/L — ABNORMAL LOW (ref 3.5–5.1)
Sodium: 137 mmol/L (ref 135–145)

## 2014-11-27 LAB — CBC
HCT: 32.6 % — ABNORMAL LOW (ref 35.0–47.0)
HEMOGLOBIN: 10.6 g/dL — AB (ref 12.0–16.0)
MCH: 27 pg (ref 26.0–34.0)
MCHC: 32.5 g/dL (ref 32.0–36.0)
MCV: 83.2 fL (ref 80.0–100.0)
PLATELETS: 222 10*3/uL (ref 150–440)
RBC: 3.92 MIL/uL (ref 3.80–5.20)
RDW: 16.3 % — ABNORMAL HIGH (ref 11.5–14.5)
WBC: 19.8 10*3/uL — AB (ref 3.6–11.0)

## 2014-11-27 MED ORDER — SODIUM CHLORIDE 0.9 % IV BOLUS (SEPSIS)
500.0000 mL | Freq: Once | INTRAVENOUS | Status: AC
  Administered 2014-11-27: 500 mL via INTRAVENOUS

## 2014-11-27 MED ORDER — HALOPERIDOL LACTATE 5 MG/ML IJ SOLN: 1.0000 mg | Freq: Four times a day (QID) | INTRAMUSCULAR | Status: DC | PRN

## 2014-11-27 NOTE — Progress Notes (Signed)
[facility] Cardiology [facility] Encounter Note  Patient: Alicia Cohen / Admit Date: 11/25/2014 / Date of Encounter: 11/27/2014, 8:09 AM   Subjective: No significant new symptoms. Patient still with tachycardia but now back in normal sinus rhythm  Review of Systems: Positive for: None Negative for: Vision change, hearing change, syncope, dizziness, nausea, vomiting,diarrhea, bloody stool, stomach pain, cough, congestion, diaphoresis, urinary frequency, urinary pain,skin lesions, skin rashes Others previously listed  Objective: Telemetry: Atrial tachycardia with rapid rate and atrial contractions Physical Exam: Blood pressure 104/51, pulse 84, temperature 98 F (36.7 C), temperature source Oral, resp. rate 14, height  (1.575 m), weight 104 lb 8 oz (47.4 kg), SpO2 100 %. Body mass index is 19.11 kg/(m^2). General: Well developed, well nourished, in no acute distress. Head: Normocephalic, atraumatic, sclera non-icteric, no xanthomas, nares are without discharge. Neck: No apparent masses Lungs: Normal respirations with few use wheezes, no rhonchi, no rales , few crackles   Heart: Regular rate and rhythm, normal S1 S2, no murmur, no rub, no gallop, PMI is normal size and placement, carotid upstroke normal without bruit, jugular venous pressure normal Abdomen: Soft, non-tender, non-distended with normoactive bowel sounds. No hepatosplenomegaly. Abdominal aorta is normal size without bruit Extremities: Trace edema, no clubbing, no cyanosis, no ulcers,  Peripheral: 2+ radial, 2+ femoral, 2+ dorsal pedal pulses Neuro: Alert and oriented. Moves all extremities spontaneously.   Intake/Output Summary (Last 24 hours) at 11/27/14 0809 Last data filed at 11/27/14 0700  Gross per 24 hour  Intake 1674.54 ml  Output      0 ml  Net 1674.54 ml    Inpatient Medications:  . antiseptic oral rinse  7 mL Mouth Rinse BID  . diltiazem  240 mg Oral Daily  . docusate sodium  100 mg Oral BID  .  loratadine  10 mg Oral Daily  . metoprolol tartrate  50 mg Oral BID  . multivitamin with minerals  1 tablet Oral Daily  . pantoprazole  40 mg Oral Daily  . piperacillin-tazobactam (ZOSYN)  IV  3.375 g Intravenous 3 times per day  . pneumococcal 23 valent vaccine  0.5 mL Intramuscular Tomorrow-1000  . rivastigmine  9.5 mg Transdermal Daily  . vancomycin  500 mg Intravenous Q36H   Infusions:  . sodium chloride 80 mL/hr at 11/27/14 0744  . diltiazem (CARDIZEM) infusion Stopped (11/27/14 0000)    Labs:  Recent Labs  11/26/14 0500 11/27/14 0434  NA 136 137  K 3.7 3.4*  CL 102 106  CO2 24 23  GLUCOSE 91 127*  BUN 23* 25*  CREATININE 1.45* 1.49*  CALCIUM 8.4* 7.9*    Recent Labs  11/25/14 0220  AST 27  ALT 21  ALKPHOS 115  BILITOT 0.4  PROT 7.0  ALBUMIN 2.7*    Recent Labs  11/26/14 0500 11/27/14 0434  WBC 18.9* 19.8*  HGB 11.2* 10.6*  HCT 34.4* 32.6*  MCV 82.7 83.2  PLT 225 222    Recent Labs  11/25/14 0220 11/25/14 0844  CKTOTAL 33*  --   TROPONINI 0.14* 0.03   Invalid input(s): POCBNP  Recent Labs  11/25/14 0220  HGBA1C 5.6     Weights: Filed Weights   11/25/14 0929 11/26/14 0513 11/27/14 0501  Weight: 96 lb 12.5 oz (43.9 kg) 97 lb (44 kg) 104 lb 8 oz (47.4 kg)     Radiology/Studies:  Ct Head Wo Contrast  11/25/2014   CLINICAL DATA:  Initial evaluation for acute trauma.  EXAM: CT HEAD WITHOUT CONTRAST  CT MAXILLOFACIAL WITHOUT CONTRAST  CT CERVICAL SPINE WITHOUT CONTRAST  TECHNIQUE: Multidetector CT imaging of the head, cervical spine, and maxillofacial structures were performed using the standard protocol without intravenous contrast. Multiplanar CT image reconstructions of the cervical spine and maxillofacial structures were also generated.  COMPARISON:  None.  FINDINGS: CT HEAD FINDINGS  Diffuse prominence of the CSF containing spaces is compatible with generalized cerebral atrophy. Patchy and confluent hypodensity within the periventricular  and deep white matter is most consistent with chronic small vessel ischemic disease.  There is new hypodensity involving predominantly the deep white matter of the anterior right frontal lobe, suspicious for possible vasogenic edema (series 2, image 14). No frank mass lesion identified. No other areas of vasogenic edema.  No acute large vessel territory infarct. No intracranial hemorrhage. No extra-axial fluid collection.  No midline shift identified. Ventricular prominence related to global parenchymal volume loss is similar to previous. No hydrocephalus.  Scalp contusion present at the right forehead. Scalp soft tissues are otherwise unremarkable.  Calvarium intact. Paranasal sinuses are clear. No mastoid effusion.  CT MAXILLOFACIAL FINDINGS  Right forehead/ periorbital contusion. Intact globes. No retro-orbital pathology. Sequelae of prior lens extraction noted bilaterally. Bony orbits intact without evidence of orbital floor fracture. Lamina perforation intact. Orbital roofs intact.  Zygomatic arch is intact. No maxillary fracture. Pterygoid plates intact. Nasal bones intact. Nasal septum bowed to the left but intact.  Mandible intact. Mandibular condyles normally situated within the temporomandibular fossa.  Paranasal sinuses are well pneumatized and free of fluid.  CT CERVICAL SPINE FINDINGS  The vertebral bodies are normally aligned with preservation of the normal cervical lordosis. Vertebral body heights are preserved. Normal C1-2 articulations are intact. No prevertebral soft tissue swelling. No acute fracture or listhesis. Moderate degenerative disc disease present at C4-5 and C5-6.  Visualized soft tissues of the neck demonstrate no acute abnormality. Interlobular septal thickening with multi focal nodular densities seen within the visualized lungs, indeterminate. Irregular biapical pleural thickening noted.  IMPRESSION: CT HEAD:  1. No definite acute intracranial process identified. 2. Scalp contusion at  the right forehead. 3. New area of apparent vasogenic edema within the anterior right frontal lobe. Further evaluation with dedicated MRI, with and without contrast is recommended. 4. Atrophy with chronic microvascular ischemic disease.  CT MAXILLOFACIAL:  1. Right forehead/periorbital contusion. 2. No other acute maxillofacial injury identified.  CT CERVICAL SPINE:  1. No CT evidence for acute traumatic injury within the cervical spine. 2. Scattered nodular densities with interlobular septal thickening within the visualized lung apices. Findings are indeterminate, but may reflect sequela of underlying infection and/or edema. Correlation with dedicated chest radiograph recommended.   Electronically Signed   By: Rise Mu M.D.   On: 11/25/2014 04:42   Ct Cervical Spine Wo Contrast  11/25/2014   CLINICAL DATA:  Initial evaluation for acute trauma.  EXAM: CT HEAD WITHOUT CONTRAST  CT MAXILLOFACIAL WITHOUT CONTRAST  CT CERVICAL SPINE WITHOUT CONTRAST  TECHNIQUE: Multidetector CT imaging of the head, cervical spine, and maxillofacial structures were performed using the standard protocol without intravenous contrast. Multiplanar CT image reconstructions of the cervical spine and maxillofacial structures were also generated.  COMPARISON:  None.  FINDINGS: CT HEAD FINDINGS  Diffuse prominence of the CSF containing spaces is compatible with generalized cerebral atrophy. Patchy and confluent hypodensity within the periventricular and deep white matter is most consistent with chronic small vessel ischemic disease.  There is new hypodensity involving predominantly the deep white matter of the anterior  right frontal lobe, suspicious for possible vasogenic edema (series 2, image 14). No frank mass lesion identified. No other areas of vasogenic edema.  No acute large vessel territory infarct. No intracranial hemorrhage. No extra-axial fluid collection.  No midline shift identified. Ventricular prominence related to  global parenchymal volume loss is similar to previous. No hydrocephalus.  Scalp contusion present at the right forehead. Scalp soft tissues are otherwise unremarkable.  Calvarium intact. Paranasal sinuses are clear. No mastoid effusion.  CT MAXILLOFACIAL FINDINGS  Right forehead/ periorbital contusion. Intact globes. No retro-orbital pathology. Sequelae of prior lens extraction noted bilaterally. Bony orbits intact without evidence of orbital floor fracture. Lamina perforation intact. Orbital roofs intact.  Zygomatic arch is intact. No maxillary fracture. Pterygoid plates intact. Nasal bones intact. Nasal septum bowed to the left but intact.  Mandible intact. Mandibular condyles normally situated within the temporomandibular fossa.  Paranasal sinuses are well pneumatized and free of fluid.  CT CERVICAL SPINE FINDINGS  The vertebral bodies are normally aligned with preservation of the normal cervical lordosis. Vertebral body heights are preserved. Normal C1-2 articulations are intact. No prevertebral soft tissue swelling. No acute fracture or listhesis. Moderate degenerative disc disease present at C4-5 and C5-6.  Visualized soft tissues of the neck demonstrate no acute abnormality. Interlobular septal thickening with multi focal nodular densities seen within the visualized lungs, indeterminate. Irregular biapical pleural thickening noted.  IMPRESSION: CT HEAD:  1. No definite acute intracranial process identified. 2. Scalp contusion at the right forehead. 3. New area of apparent vasogenic edema within the anterior right frontal lobe. Further evaluation with dedicated MRI, with and without contrast is recommended. 4. Atrophy with chronic microvascular ischemic disease.  CT MAXILLOFACIAL:  1. Right forehead/periorbital contusion. 2. No other acute maxillofacial injury identified.  CT CERVICAL SPINE:  1. No CT evidence for acute traumatic injury within the cervical spine. 2. Scattered nodular densities with  interlobular septal thickening within the visualized lung apices. Findings are indeterminate, but may reflect sequela of underlying infection and/or edema. Correlation with dedicated chest radiograph recommended.   Electronically Signed   By: Rise Mu M.D.   On: 11/25/2014 04:42   Mr Brain Wo Contrast  11/26/2014   CLINICAL DATA:  Dementia.  Brain mass.  EXAM: MRI HEAD WITHOUT CONTRAST  TECHNIQUE: Multiplanar, multiecho pulse sequences of the brain and surrounding structures were obtained without intravenous contrast.  COMPARISON:  Head CT from yesterday  FINDINGS: Motion degraded study  Calvarium and upper cervical spine: Right frontal scalp swelling. No focal marrow signal abnormality.  Orbits: No significant findings.  Sinuses and Mastoids: Clear. Mastoid and middle ears are clear.  Brain:  The right frontal mass is again noted, peripheral and in the anterior aspect of the frontal lobe. Centrally the mass is T2 and T1 hyperintense with a T2 hypo intense rim, likely hemosiderin. Anteriorly, the cavity appears partially septated. FLAIR and T2 hyperintensity surrounds the presumed cavity; no discrete neighboring mass lesion. Remote intracranial hemorrhage has been present superficially in the bilateral parietal lobes, but no overt amyloid angiopathy. Cannot evaluate for enhancement in this patient with renal insufficiency and clinical decision not to administer contrast.  Chronic small-vessel disease is moderate with ischemic gliosis around the lateral ventricles predominantly.  No acute infarct. No evidence of acute hemorrhage, hydrocephalus, or major vessel occlusion.  Brain atrophy with prominent mesial temporal involvement, correlating with history of Alzheimer's disease.  IMPRESSION: 1. The right frontal mass is consistent with subacute hematoma measuring 21 mm. This could be  from recent trauma, amyloid, or hemorrhagic mass. Since intravenous contrast could not be administered, recommend serial  imaging (with first scan in approximately 6 to 8 weeks). 2. Brain atrophy with pattern typical of Alzheimer's disease.   Electronically Signed   By: Marnee Spring M.D.   On: 11/26/2014 12:55   Dg Chest Port 1 View  11/26/2014   CLINICAL DATA:  Fever  EXAM: PORTABLE CHEST - 1 VIEW  COMPARISON:  08/04/14  FINDINGS: Heart size normal. Stable calcification of the aortic arch. Increased interstitial opacities on the left when compared to prior study. Right mid lung opacity, geometric, again identified. This is unchanged when compared to the prior examination.  IMPRESSION: 1. Unchanged right middle lobe opacity for which CT thorax is again recommended to exclude underlying malignancy. 2. Increased interstitial opacities left lung. Atypical infection is a concern. Asymmetric pulmonary edema considered possible but unlikely given absence of cardiac enlargement or significant vascular congestion and given the bilateral asymmetry.   Electronically Signed   By: Esperanza Heir M.D.   On: 11/26/2014 11:56   Ct Maxillofacial Wo Cm  11/25/2014   CLINICAL DATA:  Initial evaluation for acute trauma.  EXAM: CT HEAD WITHOUT CONTRAST  CT MAXILLOFACIAL WITHOUT CONTRAST  CT CERVICAL SPINE WITHOUT CONTRAST  TECHNIQUE: Multidetector CT imaging of the head, cervical spine, and maxillofacial structures were performed using the standard protocol without intravenous contrast. Multiplanar CT image reconstructions of the cervical spine and maxillofacial structures were also generated.  COMPARISON:  None.  FINDINGS: CT HEAD FINDINGS  Diffuse prominence of the CSF containing spaces is compatible with generalized cerebral atrophy. Patchy and confluent hypodensity within the periventricular and deep white matter is most consistent with chronic small vessel ischemic disease.  There is new hypodensity involving predominantly the deep white matter of the anterior right frontal lobe, suspicious for possible vasogenic edema (series 2, image 14).  No frank mass lesion identified. No other areas of vasogenic edema.  No acute large vessel territory infarct. No intracranial hemorrhage. No extra-axial fluid collection.  No midline shift identified. Ventricular prominence related to global parenchymal volume loss is similar to previous. No hydrocephalus.  Scalp contusion present at the right forehead. Scalp soft tissues are otherwise unremarkable.  Calvarium intact. Paranasal sinuses are clear. No mastoid effusion.  CT MAXILLOFACIAL FINDINGS  Right forehead/ periorbital contusion. Intact globes. No retro-orbital pathology. Sequelae of prior lens extraction noted bilaterally. Bony orbits intact without evidence of orbital floor fracture. Lamina perforation intact. Orbital roofs intact.  Zygomatic arch is intact. No maxillary fracture. Pterygoid plates intact. Nasal bones intact. Nasal septum bowed to the left but intact.  Mandible intact. Mandibular condyles normally situated within the temporomandibular fossa.  Paranasal sinuses are well pneumatized and free of fluid.  CT CERVICAL SPINE FINDINGS  The vertebral bodies are normally aligned with preservation of the normal cervical lordosis. Vertebral body heights are preserved. Normal C1-2 articulations are intact. No prevertebral soft tissue swelling. No acute fracture or listhesis. Moderate degenerative disc disease present at C4-5 and C5-6.  Visualized soft tissues of the neck demonstrate no acute abnormality. Interlobular septal thickening with multi focal nodular densities seen within the visualized lungs, indeterminate. Irregular biapical pleural thickening noted.  IMPRESSION: CT HEAD:  1. No definite acute intracranial process identified. 2. Scalp contusion at the right forehead. 3. New area of apparent vasogenic edema within the anterior right frontal lobe. Further evaluation with dedicated MRI, with and without contrast is recommended. 4. Atrophy with chronic microvascular ischemic disease.  CT  MAXILLOFACIAL:  1. Right forehead/periorbital contusion. 2. No other acute maxillofacial injury identified.  CT CERVICAL SPINE:  1. No CT evidence for acute traumatic injury within the cervical spine. 2. Scattered nodular densities with interlobular septal thickening within the visualized lung apices. Findings are indeterminate, but may reflect sequela of underlying infection and/or edema. Correlation with dedicated chest radiograph recommended.   Electronically Signed   By: Rise Mu M.D.   On: 11/25/2014 04:42     Assessment and Recommendation  81 y.o. female with essential hypertension chronic kidney disease stage II with the atrial fibrillation now having supraventricular tachycardia and/or atrial tachycardia with pre-atrial contractions and elevated troponin consistent with demand ischemia with increasing medications for rate control and maintenance of normal sinus rhythm likely being driven by infection 1. Continue metoprolol diltiazem combination medication management and increased dose for better heart rate control with a goal heart rate below 110 bpm and may discontinue diltiazem drip at this time with transfer to telemetry 2. No further cardiac intervention of elevated troponin consistent with demand ischemia 3. Further evaluation and assessment of possible infection which may be driving tachycardia 4. Ambulation and further adjustments of medication management and other treatments including infection  Signed, Arnoldo Hooker M.D. FACC

## 2014-11-27 NOTE — Evaluation (Signed)
Clinical/Bedside Swallow Evaluation Patient Details  Name: Alicia Cohen MRN: [id number] Date of Birth: 22-Aug-1930  Today's Date: 11/27/2014 Time: SLP Start Time (ACUTE ONLY): 1010 SLP Stop Time (ACUTE ONLY): 1110 SLP Time Calculation (min) (ACUTE ONLY): 60 min  Past Medical History:  Past Medical History  Diagnosis Date  . Hyperlipidemia   . Dementia   . Alzheimer disease   . Hypertension   . GERD (gastroesophageal reflux disease)   . Osteoporosis   . Kidney disease, chronic, stage III (moderate, EGFR 30-59 ml/min)   . DNR (do not resuscitate)    Past Surgical History:  Past Surgical History  Procedure Laterality Date  . Hip fracture surgery     HPI:  Pt admitted with history of dementia, HTN, GERD presenting to the emergency department via EMS after an unwitnessed fall at her nursing facility; pt resides in a memory care unit at [facility]. On arrival, patient was found to have atrial fibrillation with rapid ventricular rate which slowed after diltiazem 5 mg IV. CT of the head revealed an area of vasogenic edema in the vicinity of the contusion on the right forehead however physical exam reveals nothing aside from bruising of her face and head as well as poor memory. Pt verbalized frequently w/ mumbled speech and poor coherency of speech; she was distracted and often switched topics frequently. She could not answer basic/simple questions but coudl name 3 children stating 1 lived "close by". She did not consistently follow commands and did not follow instruction to leave her [place] O2 in place but took it off frequently and put it on the bed. Pt was not oriented but to person.    Assessment / Plan / Recommendation Clinical Impression  Pt appears at increased risk for aspiration w/ po trials, moreso w/ thin liquids, sec. to her presentation of delayed, overt s/s of aspiration(throat clearing, cough). Also noted pt's O2 sats declined but this appeared to coincide w/ pt taking her [place] O2  support off and putting in her lap. During the oral phase, pt exhibited longer mastication time/effort but was able to orally clear given time. Pt only accpeted a few po trials altogether and would not accept more when offered despite encouragement. MD consulted re: pt's presentation; noted WBC elevated and CXR results. Rec'd a Dys. 3 w/ Nectar liquids w/ aspiration precautions; meds in puree. ST will f/u. MD agreed.     Aspiration Risk  Mild (-moderate)    Diet Recommendation Dysphagia 3 (Mech soft);Nectar   Medication Administration: Crushed with puree (as able) Compensations: Slow rate;Small sips/bites    Other  Recommendations Recommended Consults:  (Dietician) Oral Care Recommendations: Oral care BID;Oral care before and after PO;Staff/trained caregiver to provide oral care Other Recommendations: Order thickener from pharmacy;Remove water pitcher   Follow Up Recommendations       Frequency and Duration min 3x week  1 week   Pertinent Vitals/Pain No pain voiced    SLP Swallow Goals  see care plan   Swallow Study Prior Functional Status   pt resides at a Memory Care facility; end stage Dementia per chart note. No indication of a modified diet at the NH per report.    General Date of Onset: 11/25/14 Other Pertinent Information: Pt admitted with history of dementia, HTN, GERD presenting to the emergency department via EMS after an unwitnessed fall at her nursing facility; pt resides in a memory care unit at [facility]. On arrival, patient was found to have atrial fibrillation with rapid  ventricular rate which slowed after diltiazem 5 mg IV. CT of the head revealed an area of vasogenic edema in the vicinity of the contusion on the right forehead however physical exam reveals nothing aside from bruising of her face and head as well as poor memory. Pt verbalized frequently w/ mumbled speech and poor coherency of speech; she was distracted and often switched topics frequently. She could  not answer basic/simple questions but coudl name 3 children stating 1 lived "close by". She did not consistently follow commands and did not follow instruction to leave her [place] O2 in place but took it off frequently and put it on the bed. Pt was not oriented but to person.  Type of Study: Bedside swallow evaluation Previous Swallow Assessment: none indicated Diet Prior to this Study: Regular;Thin liquids Temperature Spikes Noted: Yes (yes, x1;  WBC 19.8) Respiratory Status: Supplemental O2 delivered via (comment) History of Recent Intubation: No Behavior/Cognition: Confused;Impulsive;Distractible;Requires cueing;Doesn't follow directions Oral Cavity - Dentition: Missing dentition;Poor condition Self-Feeding Abilities: Total assist;Able to feed self (too distracted and did not follow through) Patient Positioning: Upright in chair/Tumbleform Baseline Vocal Quality: Low vocal intensity Volitional Cough: Cognitively unable to elicit Volitional Swallow: Able to elicit    Oral/Motor/Sensory Function Overall Oral Motor/Sensory Function:  (pt could not follow through but ROM WFL) Labial ROM: Within Functional Limits Labial Symmetry: Within Functional Limits Lingual ROM: Within Functional Limits Lingual Symmetry: Within Functional Limits Facial Symmetry: Within Functional Limits Mandible: Within Functional Limits   Ice Chips Ice chips: Within functional limits Presentation: Spoon (fed; 4 trials)   Thin Liquid Thin Liquid: Impaired Presentation: Cup;Straw Oral Phase Impairments:  (none) Oral Phase Functional Implications:  (none) Pharyngeal  Phase Impairments: Throat Clearing - Delayed;Cough - Delayed (x2-3 trials/5)    Nectar Thick Nectar Thick Liquid: Within functional limits Presentation: Straw;Self Fed Other Comments: pt only accepted 1 trial   Honey Thick Honey Thick Liquid: Not tested   Puree Puree: Within functional limits Presentation: Self Fed;Spoon Other Comments: pt only accepted  2 trials   Solid   GO    Solid: Impaired Presentation: Self Fed (2 bites of graham cracker) Oral Phase Impairments: Impaired anterior to posterior transit Oral Phase Functional Implications:  (increased mastication time/effort) Pharyngeal Phase Impairments:  (none)      Orinda Kenner, MS, CCC-SLP Watson,Katherine 11/27/2014,11:26 AM

## 2014-11-27 NOTE — Progress Notes (Signed)
Spoke to Cardiologist on call (Dr. Darrold Junker) about patient's low blood pressure even after 500 mL bolus. MD ordered another 500 mL bolus.

## 2014-11-27 NOTE — Evaluation (Signed)
Physical Therapy Evaluation Patient Details Name: Alicia Cohen MRN: [id number] DOB: 12/05/1930 Today's Date: 11/27/2014   History of Present Illness  Patient is an 81 y/o female that presents after a fall while at her memory care unit. Since her admission she has been in A-fib with RVR, with multiple administrations of Cardizem, and was even started on Cardizem drip on 11/26/2014. Patient has a past medical history of Alzheimer's dementia and is pleasantly confused today.   Clinical Impression  Patient is an 81 y/o female that presents after a fall from her memory care unit. During this admission she has had elevated HR, with intermittent control. Today she presents with HR in upper 90s in supine, which initially increases to 130s-140s during sitting, then returns to 107-110s after several minutes of quiet sitting. Patient is currently on 2L of O2 and maintained appropriate O2 sats (>94%) throughout session. Mobility evaluation was limited by HR today, but RN present and encouraged mobilization to chair with PT. Patient able to participate in sit to stand and ambulation, however significant vc's required for safe hand placement, motor planning, and directional skills. Patient has a baseline history of Alzheimer's dementia and is unable to provide mobility history, but it appears she had been using a RW at baseline. Given her mobility limitations (bed mobility, gait) and high risk for falls patient would benefit from additional skilled PT services to address the above deficits.     Follow Up Recommendations SNF    Equipment Recommendations       Recommendations for Other Services       Precautions / Restrictions Precautions Precautions: Fall Restrictions Weight Bearing Restrictions: No      Mobility  Bed Mobility Overal bed mobility: Needs Assistance Bed Mobility: Supine to Sit     Supine to sit: Mod assist;HOB elevated     General bed mobility comments: Patient initiates bringing  her legs to the side, then seems to either forget or become distracted with task.  Transfers Overall transfer level: Needs assistance Equipment used: Rolling walker (2 wheeled) Transfers: Sit to/from Stand Sit to Stand: Mod assist         General transfer comment: Patient requires mod A x 1 to shift her weight anteriorly and have safe and appaopriate hand placement on RW for transfer.   Ambulation/Gait Ambulation/Gait assistance: Min assist Ambulation Distance (Feet): 3 Feet Assistive device: Rolling walker (2 wheeled) Gait Pattern/deviations: Step-to pattern;Shuffle;Decreased step length - left;Decreased step length - right   Gait velocity interpretation: <1.8 ft/sec, indicative of risk for recurrent falls General Gait Details: Patient requires heavy vc's for foot placement and motor planning. Patient requires physical cuing for directional skills and shuffles her feet with minimal foot clearance during ambulation.  Stairs            Wheelchair Mobility    Modified Rankin (Stroke Patients Only)       Balance Overall balance assessment: Needs assistance Sitting-balance support: Feet unsupported Sitting balance-Leahy Scale: Good Sitting balance - Comments: Patient has mild loss of balance upon first sitting but is able to sit independently and not lose balance with MMT.   Standing balance support: Bilateral upper extremity supported Standing balance-Leahy Scale: Poor Standing balance comment: Patient has episodes of knee flexed ambulation and has poor motor planning leading to her being at risk for falls.  Pertinent Vitals/Pain Pain Assessment:  (Patient is not able to cognitively describe any pain symptoms. )    Home Living Family/patient expects to be discharged to:: Assisted living                 Additional Comments: Patient is unable to provide any reliable information.     Prior Function Level of Independence:  Independent with assistive device(s)         Comments: It appears she was using a RW? Unable to receive any reliable information.      Hand Dominance        Extremity/Trunk Assessment   Upper Extremity Assessment: Overall WFL for tasks assessed           Lower Extremity Assessment: Overall WFL for tasks assessed (Weakness in LEs, difficult to assess due to decreased cognitive status. )         Communication   Communication: No difficulties  Cognition Arousal/Alertness: Awake/alert Behavior During Therapy: WFL for tasks assessed/performed Overall Cognitive Status: History of cognitive impairments - at baseline       Memory: Decreased short-term memory (Patient presents from memory care unit, has a history of Alzheimer's dementia.)              General Comments      Exercises Other Exercises Other Exercises: Straight leg raises x 5 bilaterally  Other Exercises: Ankle pumps x 10 bilaterally       Assessment/Plan    PT Assessment Patient needs continued PT services  PT Diagnosis Difficulty walking;Generalized weakness   PT Problem List Decreased strength;Cardiopulmonary status limiting activity;Decreased activity tolerance;Decreased knowledge of use of DME;Decreased balance;Decreased mobility;Decreased safety awareness  PT Treatment Interventions DME instruction;Gait training;Balance training;Neuromuscular re-education;Therapeutic activities;Therapeutic exercise   PT Goals (Current goals can be found in the Care Plan section) Acute Rehab PT Goals Patient Stated Goal: Unable to participate in goal setting.  PT Goal Formulation: Patient unable to participate in goal setting Time For Goal Achievement: 12/11/14 Potential to Achieve Goals: Fair    Frequency Min 2X/week   Barriers to discharge        Co-evaluation               End of Session Equipment Utilized During Treatment: Gait belt Activity Tolerance: Patient tolerated treatment  well Patient left: in chair;with call bell/phone within reach;with nursing/sitter in room Nurse Communication: Mobility status (RN in room for session for HR monitoring. )         Time: 1610-9604 PT Time Calculation (min) (ACUTE ONLY): 18 min   Charges:   PT Evaluation $Initial PT Evaluation Tier I: 1 Procedure     PT G Codes:       Kerin Ransom, PT, DPT    11/27/2014, 10:57 AM

## 2014-11-27 NOTE — Progress Notes (Signed)
   11/27/14 1450  Clinical Encounter Type  Visited With Patient  Visit Type Initial  Consult/Referral To Chaplain  Spiritual Encounters  Spiritual Needs Other (Comment)  Stress Factors  Patient Stress Factors None identified  Chaplain introduced self and offered a compassionate presence and spiritual support as applicable. Chaplain Deep Bonawitz A. Comfort Iversen Ext. [phone]

## 2014-11-27 NOTE — Progress Notes (Signed)
Order IV Haldol for agitation per MD Clent Ridges

## 2014-11-27 NOTE — Progress Notes (Signed)
[facility] - [place] at [facility]   PATIENT NAME: Alicia Cohen    MR#:  [id number]  DATE OF BIRTH:  1930-08-17  SUBJECTIVE:  CHIEF COMPLAINT:   Chief Complaint  Patient presents with  . Fall  . Atrial Fibrillation   Had recurrence of atrial fibrillation with rapid ventricular rate overnight and was transferred back to the ICU. This morning in normal sinus rhythm at rate of 80. She is alert, oriented to person. No specific complaints no chest pain shortness of breath. Still very weak.  REVIEW OF SYSTEMS:   Review of Systems  Constitutional: Negative for fever.  Respiratory: Negative for shortness of breath.   Cardiovascular: Negative for chest pain and palpitations.  Gastrointestinal: Negative for nausea, vomiting and abdominal pain.  Genitourinary: Negative for dysuria.    DRUG ALLERGIES:  No Known Allergies  VITALS:  Blood pressure 141/56, pulse 104, temperature 98.6 F (37 C), temperature source Oral, resp. rate 18, height  (1.575 m), weight 46.358 kg (102 lb 3.2 oz), SpO2 100 %.  PHYSICAL EXAMINATION:  GENERAL:  81 y.o.-year-old patient lying in the bed with no acute distress. Thin, black eye, ill appearing EYES: Pupils equal, round, reactive to light and accommodation. No scleral icterus. Extraocular muscles intact.  HEENT: Swelling bruising over the right side of the forehead and right eye.  NECK:  Supple, no jugular venous distention. No thyroid enlargement, no tenderness.  LUNGS: Short shallow respirations with diffuse crackles, fair air movement CARDIOVASCULAR: Tachycardic, irregular, distant S1, S2 normal. No murmurs, rubs, or gallops.  ABDOMEN: Soft, nontender, nondistended. Bowel sounds present. No organomegaly or mass.  EXTREMITIES: No pedal edema, cyanosis, or clubbing. Muscle wasting NEUROLOGIC: Does not participate  PSYCHIATRIC: Unable to assess    LABORATORY PANEL:   CBC  Recent Labs Lab 11/27/14 0434  WBC 19.8*   HGB 10.6*  HCT 32.6*  PLT 222   ------------------------------------------------------------------------------------------------------------------  Chemistries   Recent Labs Lab 11/25/14 0220  11/27/14 0434  NA 133*  < > 137  K 3.7  < > 3.4*  CL 96*  < > 106  CO2 27  < > 23  GLUCOSE 120*  < > 127*  BUN 30*  < > 25*  CREATININE 1.51*  < > 1.49*  CALCIUM 8.7*  < > 7.9*  AST 27  --   --   ALT 21  --   --   ALKPHOS 115  --   --   BILITOT 0.4  --   --   < > = values in this interval not displayed. ------------------------------------------------------------------------------------------------------------------  Cardiac Enzymes  Recent Labs Lab 11/25/14 0844  TROPONINI 0.03   ------------------------------------------------------------------------------------------------------------------  RADIOLOGY:  Mr Brain Wo Contrast  11/26/2014   CLINICAL DATA:  Dementia.  Brain mass.  EXAM: MRI HEAD WITHOUT CONTRAST  TECHNIQUE: Multiplanar, multiecho pulse sequences of the brain and surrounding structures were obtained without intravenous contrast.  COMPARISON:  Head CT from yesterday  FINDINGS: Motion degraded study  Calvarium and upper cervical spine: Right frontal scalp swelling. No focal marrow signal abnormality.  Orbits: No significant findings.  Sinuses and Mastoids: Clear. Mastoid and middle ears are clear.  Brain:  The right frontal mass is again noted, peripheral and in the anterior aspect of the frontal lobe. Centrally the mass is T2 and T1 hyperintense with a T2 hypo intense rim, likely hemosiderin. Anteriorly, the cavity appears partially septated. FLAIR and T2 hyperintensity surrounds the presumed cavity; no discrete neighboring mass lesion. Remote intracranial  hemorrhage has been present superficially in the bilateral parietal lobes, but no overt amyloid angiopathy. Cannot evaluate for enhancement in this patient with renal insufficiency and clinical decision not to administer  contrast.  Chronic small-vessel disease is moderate with ischemic gliosis around the lateral ventricles predominantly.  No acute infarct. No evidence of acute hemorrhage, hydrocephalus, or major vessel occlusion.  Brain atrophy with prominent mesial temporal involvement, correlating with history of Alzheimer's disease.  IMPRESSION: 1. The right frontal mass is consistent with subacute hematoma measuring 21 mm. This could be from recent trauma, amyloid, or hemorrhagic mass. Since intravenous contrast could not be administered, recommend serial imaging (with first scan in approximately 6 to 8 weeks). 2. Brain atrophy with pattern typical of Alzheimer's disease.   Electronically Signed   By: Marnee Spring M.D.   On: 11/26/2014 12:55   Dg Chest Port 1 View  11/26/2014   CLINICAL DATA:  Fever  EXAM: PORTABLE CHEST - 1 VIEW  COMPARISON:  08/04/14  FINDINGS: Heart size normal. Stable calcification of the aortic arch. Increased interstitial opacities on the left when compared to prior study. Right mid lung opacity, geometric, again identified. This is unchanged when compared to the prior examination.  IMPRESSION: 1. Unchanged right middle lobe opacity for which CT thorax is again recommended to exclude underlying malignancy. 2. Increased interstitial opacities left lung. Atypical infection is a concern. Asymmetric pulmonary edema considered possible but unlikely given absence of cardiac enlargement or significant vascular congestion and given the bilateral asymmetry.   Electronically Signed   By: Esperanza Heir M.D.   On: 11/26/2014 11:56    EKG:   Orders placed or performed during the hospital encounter of 11/25/14  . ED EKG  . ED EKG  . EKG 12-Lead  . EKG 12-Lead    ASSESSMENT AND PLAN:   #1 atrial fibrillation with RVR:  - cardiology following - This morning is in normal sinus with a rate of 80. Transfer back to telemetry. High heart rates likely being driven by pneumonia. - no anticoagulation due  to fall risk and possibly hemorrhagic right frontal lobe mass  #2 elevated troponin: This was likely due to tachycardia, now decreased back to normal. Not ACS  #3 fall with head injury: - appreciate neurology consultation, has signed off - MRI with subacute hematoma possible hemorrhagic mass/contusion - no anticoagulation  #4 chronic kidney disease stage 4: Stable  #5 dementia: Stable. No behavioral disturbance No sitter is needed.  #6 leucocytosis: - blood cx pending, initial UA without infection - CXR with questionable opacity in LLL, start treatment for HCAP (from SNF) with vanc, zosyn  #7 clinical malnutrition - suppliment. Speech therapy evaluation today with concern for aspiration. She did become hypoxic during evaluation. New diet recommendations appreciated.  Prophylaxis heparin for DVT prophylaxis, no GI prophylaxis  CODE STATUS: DO NOT RESUSCITATE  TOTAL TIME TAKING CARE OF THIS PATIENT: 40 minutes.  Greater than 50% of time spent in care coordination and counseling. All the records are reviewed and case discussed with Care Management/Social Worker. Management plans discussed with the patient, family and they are in agreement. Spoke with her husband and caregiver at length.  POSSIBLE D/C IN 2-3 DAYS, DEPENDING ON CLINICAL CONDITION.   Elby Showers M.D on 11/27/2014 at 4:21 PM  Between 7am to 6pm - Pager - [phone]  After 6pm go to [email or web address] - password [email or web address] [facility]  [facility]  Office  [phone]  CC: Primary care physician; Jerl Mina, MD

## 2014-11-27 NOTE — Progress Notes (Signed)
ANTIBIOTIC CONSULT NOTE - INITIAL  Pharmacy Consult for Zosyn Indication: leukocytosis\aspiration pneumonia   No Known Allergies  Patient Measurements: Height: '5\' 2"'  (157.5 cm) Weight: 104 lb 8 oz (47.4 kg) IBW/kg (Calculated) : 50.1 Adjusted Body Weight:   Vital Signs: Temp: 98.4 F (36.9 C) (07/22 1200) Temp Source: Oral (07/22 1200) BP: 109/75 mmHg (07/22 1200) Pulse Rate: 53 (07/22 1100) Intake/Output from previous day: 07/21 0701 - 07/22 0700 In: 1754.5 [P.O.:100; I.V.:1504.5; IV Piggyback:150] Out: -  Intake/Output from this shift: Total I/O In: 120 [P.O.:120] Out: -   Labs:  Recent Labs  11/25/14 0220 11/26/14 0500 11/27/14 0434  WBC 16.3* 18.9* 19.8*  HGB 12.0 11.2* 10.6*  PLT 190 225 222  CREATININE 1.51* 1.45* 1.49*   Estimated Creatinine Clearance: 21.8 mL/min (by C-G formula based on Cr of 1.49). No results for input(s): VANCOTROUGH, VANCOPEAK, VANCORANDOM, GENTTROUGH, GENTPEAK, GENTRANDOM, TOBRATROUGH, TOBRAPEAK, TOBRARND, AMIKACINPEAK, AMIKACINTROU, AMIKACIN in the last 72 hours.   Microbiology: Recent Results (from the past 720 hour(s))  MRSA PCR Screening     Status: None   Collection Time: 11/25/14  1:55 AM  Result Value Ref Range Status   MRSA by PCR NEGATIVE NEGATIVE Final    Comment:        The GeneXpert MRSA Assay (FDA approved for NASAL specimens only), is one component of a comprehensive MRSA colonization surveillance program. It is not intended to diagnose MRSA infection nor to guide or monitor treatment for MRSA infections.     Medical History: Past Medical History  Diagnosis Date  . Hyperlipidemia   . Dementia   . Alzheimer disease   . Hypertension   . GERD (gastroesophageal reflux disease)   . Osteoporosis   . Kidney disease, chronic, stage III (moderate, EGFR 30-59 ml/min)   . DNR (do not resuscitate)     Medications:  Scheduled:  . antiseptic oral rinse  7 mL Mouth Rinse BID  . diltiazem  240 mg Oral Daily  .  docusate sodium  100 mg Oral BID  . loratadine  10 mg Oral Daily  . metoprolol tartrate  50 mg Oral BID  . multivitamin with minerals  1 tablet Oral Daily  . pantoprazole  40 mg Oral Daily  . piperacillin-tazobactam (ZOSYN)  IV  3.375 g Intravenous 3 times per day  . rivastigmine  9.5 mg Transdermal Daily   Assessment:  Cultures not positive to date. Low risk for MRSA so we stopped vancomycin. Renal function is borderline for Zosyn dosing - reordered BMP for tomorrow morning.   Goal of Therapy:  WBC normalization, prevention/resolution of infection.   Plan:  Continue Zosyn 3.375 gm IV Q8H over 4 hours. Adjusted as needed based on renal function.   Alicia Cohen 11/27/2014,12:20 PM

## 2014-11-27 NOTE — Plan of Care (Signed)
Problem: SLP Dysphagia Goals Goal: Misc Dysphagia Goal Pt will safely tolerate po diet of least restrictive consistency w/ no overt s/s of aspiration noted by Staff/pt/family x3 sessions.    

## 2014-11-27 NOTE — Progress Notes (Addendum)
Spoke to Cardiologist on call (Dr. Darrold Junker) about patient's low blood pressure. Cardizem drip had been stopped per it's order before call. MD ordered drip to remain off as long as patient in sinus rhythm and to give 500 mL one time fluid bolus.

## 2014-11-27 NOTE — Clinical Social Work Note (Signed)
CSW noted patient has transferred back to ICU level of care due to heart rate. CSW also noted that PT assessed patient and has recommended STR. Patient only ambulated 3 ft. CSW contacted [facility] and spoke to Shanda Bumps [phone])  who stated that this was not patient's norm and that she had been ambulatory at their facility with a walker. Shanda Bumps stated she would need to come and evaluate patient prior to being able to say if they could take patient back at discharge. Shanda Bumps is going to plan on coming on Monday morning to evaluate patient.  York Spaniel MSW,LCSW [phone]

## 2014-11-27 NOTE — Care Management Important Message (Signed)
Important Message  Patient Details  Name: Alicia Cohen MRN: [id number] Date of Birth: [birth year]   Medicare Important Message Given:  Yes-second notification given    Olegario Messier A Allmond 11/27/2014, 10:01 AM

## 2014-11-27 NOTE — Progress Notes (Signed)
Initial Nutrition Assessment     INTERVENTION:  Medical Food Supplement Therapy: pt verbalizes that she likes milkshakes and icecream; recommend addition of Mighty Shakes TID with meals, Magic Cup daily.  Meals and Snacks: Cater to patient preferences  NUTRITION DIAGNOSIS:   Inadequate oral intake related to poor appetite, chronic illness as evidenced by meal completion < 50%.   GOAL:   Patient will meet greater than or equal to 90% of their needs   MONITOR:    (Energy Intake, Anthropometrics, Electrolyte/Renal Profile, Digestive System, Glucose Profile)  REASON FOR ASSESSMENT:   Malnutrition Screening Tool    ASSESSMENT:   Pt admitted s/p fall, afib with RVR, concussion; pt alert, confused; sitting in chair eating  Breakfast on visit this AM  Past Medical History  Diagnosis Date  . Hyperlipidemia   . Dementia   . Alzheimer disease   . Hypertension   . GERD (gastroesophageal reflux disease)   . Osteoporosis   . Kidney disease, chronic, stage III (moderate, EGFR 30-59 ml/min)   . DNR (do not resuscitate)      Diet Order:  DIET DYS 3 Room service appropriate?: No; Fluid consistency:: Nectar Thick; SLP evaluated this AM  Energy Intake: recorded po intake 25% at breakfast yesterday, no other intake recorded; pt eating some blueberry muffin, bites of eggs on visit this AM  Food and nutrition related history: unable to assess, pt unable to verbalize this specifically  Electrolyte and Renal Profile:  Recent Labs Lab 11/25/14 0220 11/26/14 0500 11/27/14 0434  BUN 30* 23* 25*  CREATININE 1.51* 1.45* 1.49*  NA 133* 136 137  K 3.7 3.7 3.4*   Glucose Profile: No results for input(s): GLUCAP in the last 72 hours.  Protein Profile:  Recent Labs Lab 11/25/14 0220  ALBUMIN 2.7*   Meds: NS at 80 ml/hr, MVI, colace  Skin:  Reviewed, no issues  Last BM:  7/22   Nutrition Focused Physical Exam: deferred, as pt in chair eating breakfast Height:   Ht Readings  from Last 1 Encounters:  11/27/14 '5\' 2"'  (1.575 m)    Weight: pt unable to verbalize changes in weight, noted documented weights from last 3 encounters below  Wt Readings from Last 1 Encounters:  11/27/14 104 lb 8 oz (47.4 kg)     Wt Readings from Last 10 Encounters:  11/27/14 104 lb 8 oz (47.4 kg)  11/26/14 97 lb (43.999 kg)  11/05/14 100 lb (45.36 kg)    BMI:  Body mass index is 19.11 kg/(m^2).  Estimated Nutritional Needs:   Kcal:  3414-4360 kcals (BEE 887, 1.3 AF, 1.1-1.3 IF) using wt of 47.4 kg  Protein:  52-61 g (1.1-1.3 g/kg)   Fluid:  1410-1645 mL (30-35 ml/kg)     MODERATE Care Level  Kerman Passey MS, RD, LDN [phone] Pager

## 2014-11-28 LAB — BASIC METABOLIC PANEL
Anion gap: 8 (ref 5–15)
BUN: 24 mg/dL — AB (ref 6–20)
CALCIUM: 8.5 mg/dL — AB (ref 8.9–10.3)
CO2: 23 mmol/L (ref 22–32)
Chloride: 107 mmol/L (ref 101–111)
Creatinine, Ser: 1.5 mg/dL — ABNORMAL HIGH (ref 0.44–1.00)
GFR calc Af Amer: 36 mL/min — ABNORMAL LOW (ref >60)
GFR, EST NON AFRICAN AMERICAN: 31 mL/min — AB (ref >60)
Glucose, Bld: 112 mg/dL — ABNORMAL HIGH (ref 65–99)
Potassium: 3.1 mmol/L — ABNORMAL LOW (ref 3.5–5.1)
SODIUM: 138 mmol/L (ref 135–145)

## 2014-11-28 MED ORDER — AMOXICILLIN-POT CLAVULANATE 400-57 MG/5ML PO SUSR
400.0000 mg | Freq: Two times a day (BID) | ORAL | Status: DC
  Administered 2014-11-28 – 2014-11-30 (×4): 400 mg via ORAL
  Filled 2014-11-28: qty 1
  Filled 2014-11-28 (×8): qty 5

## 2014-11-28 NOTE — Progress Notes (Signed)
Patient has rested quietly today. Short period in the morning where patient was anxious. Patient has had safety mits on due to pulling her oxygen off repeatedly, removed intermittently to protect skin. Oxygen sats on room air are around 80%, currently on 2 liters. Only alert to self. Will continue to monitor.

## 2014-11-28 NOTE — Progress Notes (Signed)
Patient's heart rate is currently jumping from the 120's to the 130's. Dr. Allena Katz stated to go ahead and give morning medications for heart rate. Will give and continue to monitor.

## 2014-11-28 NOTE — Progress Notes (Signed)
[facility] - [place] at [facility]   PATIENT NAME: Alicia Cohen    MR#:  [id number]  DATE OF BIRTH:  February 26, 1931  SUBJECTIVE:  Dementia. pleasant but poor historian  REVIEW OF SYSTEMS:   Review of Systems  Unable to perform ROS: dementia  Constitutional: Negative for fever, chills and weight loss.  HENT: Negative for ear discharge, ear pain and nosebleeds.   Eyes: Negative for blurred vision, pain and discharge.  Respiratory: Negative for sputum production, shortness of breath, wheezing and stridor.   Cardiovascular: Negative for chest pain, palpitations, orthopnea and PND.  Gastrointestinal: Negative for nausea, vomiting, abdominal pain and diarrhea.  Genitourinary: Negative for urgency and frequency.  Musculoskeletal: Negative for back pain and joint pain.  Neurological: Negative for sensory change, speech change, focal weakness and weakness.  Psychiatric/Behavioral: Negative for depression and hallucinations. The patient is not nervous/anxious.    Tolerating Diet:yes   DRUG ALLERGIES:  No Known Allergies  VITALS:  Blood pressure 138/58, pulse 99, temperature 98.8 F (37.1 C), temperature source Oral, resp. rate 17, height  (1.575 m), weight 46.267 kg (102 lb), SpO2 96 %.  PHYSICAL EXAMINATION:   Physical Exam  GENERAL:  81 y.o.-year-old patient lying in the bed with no acute distress. Thin, cachectic EYES: Pupils equal, round, reactive to light and accommodation. No scleral icterus. Extraocular muscles intact.  HEENT: Head atraumatic, normocephalic. Oropharynx and nasopharynx clear. Dry mucosa NECK:  Supple, no jugular venous distention. No thyroid enlargement, no tenderness.  LUNGS: Normal breath sounds bilaterally, no wheezing, rales, rhonchi. No use of accessory muscles of respiration.  CARDIOVASCULAR: S1, S2 normal. No murmurs, rubs, or gallops.  ABDOMEN: Soft, nontender, nondistended. Bowel sounds present. No organomegaly or mass.   EXTREMITIES: No cyanosis, clubbing or edema b/l.    NEUROLOGIC: grossly normal. Cannot assess due to dementia  PSYCHIATRIC: The patient is alert and disoriented.  SKIN: No obvious rash, lesion, or ulcer.    LABORATORY PANEL:   CBC  Recent Labs Lab 11/27/14 0434  WBC 19.8*  HGB 10.6*  HCT 32.6*  PLT 222    Chemistries   Recent Labs Lab 11/25/14 0220  11/28/14 0451  NA 133*  < > 138  K 3.7  < > 3.1*  CL 96*  < > 107  CO2 27  < > 23  GLUCOSE 120*  < > 112*  BUN 30*  < > 24*  CREATININE 1.51*  < > 1.50*  CALCIUM 8.7*  < > 8.5*  AST 27  --   --   ALT 21  --   --   ALKPHOS 115  --   --   BILITOT 0.4  --   --   < > = values in this interval not displayed.  Cardiac Enzymes  Recent Labs Lab 11/25/14 0844  TROPONINI 0.03    ASSESSMENT AND PLAN:  81 y. o female with history of dementia presents to the emergency department via EMS after an unwitnessed fall at her nursing facility. On arrival patient was found to have atrial fibrillation with rapid ventricular rate  #1 atrial fibrillation with RVR:  - cardiology following -  High heart rates likely being driven by pneumonia. - no anticoagulation due to fall risk and possibly hemorrhagic right frontal lobe mass Pt asymptomatic  #2 elevated troponin: This was likely due to tachycardia, now decreased back to normal. Not ACS  #3 fall with head injury: - appreciate neurology consultation, has signed off - MRI with subacute  hematoma possible hemorrhagic mass/contusion - no anticoagulation  #4 chronic kidney disease stage 4: Stable  #5 dementia: Stable. No behavioral disturbance No sitter is needed.  #6 leucocytosis: - blood cx negatvie, initial UA without infection - CXR with questionable opacity in LLL, start treatment for HCAP (from SNF) with vanc, zosyn -will change to po levaquin at d/c  #7 clinical malnutrition - suppliment. Speech therapy evaluation today with concern for aspiration. She did become  hypoxic during evaluation. New diet recommendations appreciated.  Prophylaxis heparin for DVT prophylaxis, no GI prophylaxis  CODE STATUS: DO NOT RESUSCITATE  Case discussed with Care Management/Social Worker. Management plans discussed with the patient  TOTAL TIME TAKING CARE OF THIS PATIENT: 25 minutes.  >50% time spent on counselling and coordination of care with RN. No family present  POSSIBLE D/C IN 1-2 DAYS, DEPENDING ON CLINICAL CONDITION.   Oumar Marcott M.D on 11/28/2014 at 2:21 PM  Between 7am to 6pm - Pager - [phone]  After 6pm go to [email or web address] - password [email or web address] [facility]  [facility]  Office  [phone]  CC: Primary care physician; Jerl Mina, MD

## 2014-11-29 MED ORDER — POTASSIUM CHLORIDE CRYS ER 20 MEQ PO TBCR
40.0000 meq | EXTENDED_RELEASE_TABLET | Freq: Once | ORAL | Status: AC
  Administered 2014-11-29: 40 meq via ORAL
  Filled 2014-11-29: qty 2

## 2014-11-29 MED ORDER — FUROSEMIDE 10 MG/ML IJ SOLN
20.0000 mg | Freq: Once | INTRAMUSCULAR | Status: AC
  Administered 2014-11-29: 20 mg via INTRAVENOUS
  Filled 2014-11-29: qty 2

## 2014-11-29 NOTE — Progress Notes (Signed)
[facility] - [place] at [facility]   PATIENT NAME: Alicia Cohen    MR#:  [id number]  DATE OF BIRTH:  10-Jan-1931  SUBJECTIVE:  Dementia. pleasant but poor historian  Ate decent F per RN. sats 90's on 2liters REVIEW OF SYSTEMS:   Review of Systems  Unable to perform ROS: dementia   Tolerating Diet:yes PT recomemnds rehab  DRUG ALLERGIES:  No Known Allergies  VITALS:  Blood pressure 170/78, pulse 116, temperature 97.9 F (36.6 C), temperature source Oral, resp. rate 20, height  (1.575 m), weight 46.72 kg (103 lb), SpO2 90 %.  PHYSICAL EXAMINATION:   Physical Exam  GENERAL:  81 y.o.-year-old patient lying in the bed with no acute distress. Thin, cachectic EYES: Pupils equal, round, reactive to light and accommodation. No scleral icterus. Extraocular muscles intact.  HEENT: Head atraumatic, normocephalic. Oropharynx and nasopharynx clear. Dry mucosa NECK:  Supple, no jugular venous distention. No thyroid enlargement, no tenderness.  LUNGS: Normal breath sounds bilaterally, no wheezing, rales, rhonchi. No use of accessory muscles of respiration.  CARDIOVASCULAR: S1, S2 normal. No murmurs, rubs, or gallops.  ABDOMEN: Soft, nontender, nondistended. Bowel sounds present. No organomegaly or mass.  EXTREMITIES: No cyanosis, clubbing or edema b/l.    NEUROLOGIC: grossly normal. Cannot assess due to dementia  PSYCHIATRIC: The patient is alert and disoriented.  SKIN: No obvious rash, lesion, or ulcer.    LABORATORY PANEL:   CBC  Recent Labs Lab 11/27/14 0434  WBC 19.8*  HGB 10.6*  HCT 32.6*  PLT 222    Chemistries   Recent Labs Lab 11/25/14 0220  11/28/14 0451  NA 133*  < > 138  K 3.7  < > 3.1*  CL 96*  < > 107  CO2 27  < > 23  GLUCOSE 120*  < > 112*  BUN 30*  < > 24*  CREATININE 1.51*  < > 1.50*  CALCIUM 8.7*  < > 8.5*  AST 27  --   --   ALT 21  --   --   ALKPHOS 115  --   --   BILITOT 0.4  --   --   < > = values in this  interval not displayed.  Cardiac Enzymes  Recent Labs Lab 11/25/14 0844  TROPONINI 0.03    ASSESSMENT AND PLAN:  62 y. o female with history of dementia presents to the emergency department via EMS after an unwitnessed fall at her nursing facility. On arrival patient was found to have atrial fibrillation with rapid ventricular rate  #1 atrial fibrillation with RVR:  - cardiology following -  High heart rates likely being driven by pneumonia. - no anticoagulation due to fall risk and possibly hemorrhagic right frontal lobe mass Pt asymptomatic  #2 penumonia Change to po augmentin -requiring about 2 liter [place] oxygen -trial of lasix x1 today -cxr 7/21 showed possible opacity right UL. Will defer to PCP as out tpt for f/u CT chest or cxr to ensure clearing after completion of abxs  #3 fall with head injury: - appreciate neurology consultation, has signed off - MRI with subacute hematoma possible hemorrhagic mass/contusion - no anticoagulation  #4 chronic kidney disease stage 4: Stable  #5 dementia: Stable. No behavioral disturbance No sitter is needed.  #6 leucocytosis: - blood cx negatvie, initial UA without infection - CXR with questionable opacity in LLL, start treatment for HCAP (from SNF) with vanc, zosyn -will change to po levaquin at d/c  #7 clinical malnutrition -  suppliment. Speech therapy evaluation today with concern for aspiration. She did become hypoxic during evaluation. New diet recommendations appreciated.  Prophylaxis heparin for DVT prophylaxis, no GI prophylaxis  CODE STATUS: DO NOT RESUSCITATE  Case discussed with Care Management/Social Worker.  TOTAL TIME TAKING CARE OF THIS PATIENT: 25 minutes.  >50% time spent on counselling and coordination of care with RN. No family present  POSSIBLE D/C IN 1-2 DAYS, DEPENDING ON CLINICAL CONDITION.   Amillya Chavira M.D on 11/29/2014 at 11:03 AM  Between 7am to 6pm - Pager - [phone]  After 6pm go to  [email or web address] - password [email or web address] [facility]  [place] [facility]  Office  [phone]  CC: Primary care physician; Jerl Mina, MD

## 2014-11-29 NOTE — Progress Notes (Signed)
Patient presently resting in the bed, no acute distress noted, pt remains confused, impulsive, 1/1 sitter at bedside, ST on the monitor, son called and updated about pt condition, will continue to monitor.

## 2014-11-29 NOTE — Progress Notes (Signed)
PT. continues with safety mits to bilateral hands without skin issues observed, Pt. resgting peacefully in bed at this time.

## 2014-11-30 MED ORDER — DILTIAZEM HCL ER COATED BEADS 240 MG PO CP24: 240.0000 mg | ORAL_CAPSULE | Freq: Every day | ORAL | Status: AC

## 2014-11-30 MED ORDER — AMOXICILLIN-POT CLAVULANATE 400-57 MG/5ML PO SUSR: 400.0000 mg | Freq: Two times a day (BID) | ORAL | Status: AC

## 2014-11-30 MED ORDER — METOPROLOL TARTRATE 50 MG PO TABS: 50.0000 mg | ORAL_TABLET | Freq: Two times a day (BID) | ORAL | Status: AC

## 2014-11-30 NOTE — Progress Notes (Signed)
Patient being discharge today to [facility] care today,  Pt respiration improving, remains on oxygen 2 L [place] with no SOB noted, mood calm, report called to receiving nurse, POA  Aware of of discharge instruction, EMS notified, awaiting for transport.

## 2014-11-30 NOTE — Discharge Summary (Signed)
[place] at [place] NAME: Alicia Cohen    MR#:  [id number]  DATE OF BIRTH:  02-15-1931  DATE OF ADMISSION:  11/25/2014 ADMITTING PHYSICIAN: Harrie Foreman, MD  DATE OF DISCHARGE: 11/30/14  PRIMARY CARE PHYSICIAN: Maryland Pink, MD    ADMISSION DIAGNOSIS:  NSTEMI (non-ST elevated myocardial infarction) [I21.4] Atrial fibrillation with rapid ventricular response [I48.91]  DISCHARGE DIAGNOSIS:  New onset Afibrillation Mild CHF  with hypoxia requiring oxygen Pneumonia Advance Dementia  SECONDARY DIAGNOSIS:   Past Medical History  Diagnosis Date  . Hyperlipidemia   . Dementia   . Alzheimer disease   . Hypertension   . GERD (gastroesophageal reflux disease)   . Osteoporosis   . Kidney disease, chronic, stage III (moderate, EGFR 30-59 ml/min)   . DNR (do not resuscitate)     HOSPITAL COURSE:    81 y. o female with history of dementia presents to the emergency department via EMS after an unwitnessed fall at her nursing facility. On arrival patient was found to have atrial fibrillation with rapid ventricular rate  #1 atrial fibrillation with RVR:  - High heart rates likely being driven by pneumonia. - no anticoagulation due to fall risk and possibly hemorrhagic right frontal lobe mass Pt asymptomatic -on cardizem and BB  #2 pneumonia with increased work of breathing likley has mild CHF  Change to po augmentin -requiring about 2 liter [place] oxygen -trial of lasix for 2 days. Will need oxygen and can be weaned off at [facility] -cxr 7/21 showed possible opacity right UL. Will defer to PCP as out tpt for f/u CT chest or cxr to ensure clearing after completion of abxs  #3 fall with head injury: - appreciate neurology consultation, has signed off - MRI with subacute hematoma possible hemorrhagic mass/contusion - no anticoagulation  #4 chronic kidney disease stage 4: Stable  #5 dementia: Stable. No behavioral  disturbance No sitter is needed.  #6 leucocytosis: - blood cx negatvie, initial UA without infection - CXR with questionable opacity in LLL, recieved treatment for HCAP (from SNF) with vanc, zosyn -will change to po augmentin  #7 clinical malnutrition - suppliment. Speech therapy evaluation today with concern for aspiration.New diet recommendations appreciated.  Prophylaxis heparin for DVT prophylaxis, no GI prophylaxis Overall improving D/c back to [facility] with HHPT  CODE STATUS: DO NOT RESUSCITATE  DISCHARGE CONDITIONS:   fair  CONSULTS OBTAINED:  Treatment Team:  Corey Skains, MD Valora Corporal, MD  DRUG ALLERGIES:  No Known Allergies  DISCHARGE MEDICATIONS:   Current Discharge Medication List    START taking these medications   Details  amoxicillin-clavulanate (AUGMENTIN) 400-57 MG/5ML suspension Take 5 mLs (400 mg total) by mouth every 12 (twelve) hours. For 2 days Qty: 100 mL, Refills: 0    diltiazem (CARDIZEM CD) 240 MG 24 hr capsule Take 1 capsule (240 mg total) by mouth daily. Qty: 30 capsule, Refills: 0    !! metoprolol (LOPRESSOR) 50 MG tablet Take 1 tablet (50 mg total) by mouth 2 (two) times daily. Qty: 30 tablet, Refills: 0     !! - Potential duplicate medications found. Please discuss with provider.    CONTINUE these medications which have NOT CHANGED   Details  guaifenesin (HUMIBID E) 400 MG TABS tablet Take 400 mg by mouth 2 (two) times daily.    lisinopril (PRINIVIL,ZESTRIL) 5 MG tablet Take 5 mg by mouth daily.    loratadine (CLARITIN) 10 MG tablet Take 10  mg by mouth daily.    !! metoprolol tartrate (LOPRESSOR) 25 MG tablet Take 25 mg by mouth 2 (two) times daily.    Multiple Vitamin (MULTIVITAMIN) tablet Take 1 tablet by mouth daily.    omeprazole (PRILOSEC) 40 MG capsule Take 40 mg by mouth daily.    rivastigmine (EXELON) 9.5 mg/24hr Place 9.5 mg onto the skin daily.     !! - Potential duplicate medications found. Please  discuss with provider.    STOP taking these medications     Multiple Vitamin (MULTIVITAMIN) capsule         If you experience worsening of your admission symptoms, develop shortness of breath, life threatening emergency, suicidal or homicidal thoughts you must seek medical attention immediately by calling 911 or calling your MD immediately  if symptoms less severe.  You Must read complete instructions/literature along with all the possible adverse reactions/side effects for all the Medicines you take and that have been prescribed to you. Take any new Medicines after you have completely understood and accept all the possible adverse reactions/side effects.   Please note  You were cared for by a hospitalist during your hospital stay. If you have any questions about your discharge medications or the care you received while you were in the hospital after you are discharged, you can call the unit and asked to speak with the hospitalist on call if the hospitalist that took care of you is not available. Once you are discharged, your primary care physician will handle any further medical issues. Please note that NO REFILLS for any discharge medications will be authorized once you are discharged, as it is imperative that you return to your primary care physician (or establish a relationship with a primary care physician if you do not have one) for your aftercare needs so that they can reassess your need for medications and monitor your lab values. Today   SUBJECTIVE   No complaints   VITAL SIGNS:  Blood pressure 159/82, pulse 108, temperature 98.4 F (36.9 C), temperature source Oral, resp. rate 20, height '5\' 2"'  (1.575 m), weight 43.772 kg (96 lb 8 oz), SpO2 88 %.  I/O:    Intake/Output Summary (Last 24 hours) at 11/30/14 1310 Last data filed at 11/30/14 1053  Gross per 24 hour  Intake    840 ml  Output    200 ml  Net    640 ml    PHYSICAL EXAMINATION:  GENERAL:  81 y.o.-year-old patient  lying in the bed with no acute distress. thin EYES: Pupils equal, round, reactive to light and accommodation. No scleral icterus. Extraocular muscles intact.  HEENT: Head atraumatic, normocephalic. Oropharynx and nasopharynx clear.  NECK:  Supple, no jugular venous distention. No thyroid enlargement, no tenderness.  LUNGS:distant breath sounds bilaterally, no wheezing, rales,rhonchi or crepitation. No use of accessory muscles of respiration.  CARDIOVASCULAR: S1, S2 normal. No murmurs, rubs, or gallops.  ABDOMEN: Soft, non-tender, non-distended. Bowel sounds present. No organomegaly or mass.  EXTREMITIES: No pedal edema, cyanosis, or clubbing.  NEUROLOGIC: unable to assess but grossly nonfocal. . Gait not checked.  PSYCHIATRIC:  patient is alert.severe dementia  SKIN: No obvious rash, lesion, or ulcer.   DATA REVIEW:   CBC   Recent Labs Lab 11/27/14 0434  WBC 19.8*  HGB 10.6*  HCT 32.6*  PLT 222    Chemistries   Recent Labs Lab 11/25/14 0220  11/28/14 0451  NA 133*  < > 138  K 3.7  < > 3.1*  CL 96*  < > 107  CO2 27  < > 23  GLUCOSE 120*  < > 112*  BUN 30*  < > 24*  CREATININE 1.51*  < > 1.50*  CALCIUM 8.7*  < > 8.5*  AST 27  --   --   ALT 21  --   --   ALKPHOS 115  --   --   BILITOT 0.4  --   --   < > = values in this interval not displayed.  Microbiology Results   Recent Results (from the past 240 hour(s))  MRSA PCR Screening     Status: None   Collection Time: 11/25/14  1:55 AM  Result Value Ref Range Status   MRSA by PCR NEGATIVE NEGATIVE Final    Comment:        The GeneXpert MRSA Assay (FDA approved for NASAL specimens only), is one component of a comprehensive MRSA colonization surveillance program. It is not intended to diagnose MRSA infection nor to guide or monitor treatment for MRSA infections.   Culture, blood (routine x 2)     Status: None (Preliminary result)   Collection Time: 11/26/14  1:06 PM  Result Value Ref Range Status   Specimen  Description BLOOD RIGHT ASSIST CONTROL  Final   Special Requests BOTTLES DRAWN AEROBIC AND ANAEROBIC 5CC  Final   Culture NO GROWTH 4 DAYS  Final   Report Status PENDING  Incomplete  Culture, blood (routine x 2)     Status: None (Preliminary result)   Collection Time: 11/26/14  1:23 PM  Result Value Ref Range Status   Specimen Description BLOOD RIGHT ARM  Final   Special Requests BOTTLES DRAWN AEROBIC AND ANAEROBIC 5CC  Final   Culture NO GROWTH 4 DAYS  Final   Report Status PENDING  Incomplete    RADIOLOGY:  No results found.   Management plans discussed with the patient, family and they are in agreement.  CODE STATUS:     Code Status Orders        Start     Ordered   11/26/14 1002  Do not attempt resuscitation (DNR)   Continuous    Question Answer Comment  In the event of cardiac or respiratory ARREST Do not call a "code blue"   In the event of cardiac or respiratory ARREST Do not perform Intubation, CPR, defibrillation or ACLS   In the event of cardiac or respiratory ARREST Use medication by any route, position, wound care, and other measures to relive pain and suffering. May use oxygen, suction and manual treatment of airway obstruction as needed for comfort.      11/26/14 1002    Advance Directive Documentation        Most Recent Value   Type of Advance Directive  Out of facility DNR (pink MOST or yellow form) [paper copy in chart, confirmed with son]   Pre-existing out of facility DNR order (yellow form or pink MOST form)  Yellow form placed in chart (order not valid for inpatient use), Physician notified to receive inpatient order   "MOST" Form in Place?        TOTAL TIME TAKING CARE OF THIS PATIENT: 40 minutes.    Anamae Rochelle M.D on 11/30/2014 at 1:10 PM  Between 7am to 6pm - Pager - [phone] After 6pm go to [email or web address] - password [email or web address] [facility]  [place] [facility]  Office  [phone]  CC: Primary care physician; [place] Pink, MD

## 2014-11-30 NOTE — Care Management (Signed)
Informed patient's son- Audie Box of discharge, the need for home 02 and home health nursing and physical therapy.  He inquired about whether patient's eye glasses have been found.  Referred this concern to primary nurse and asked her to respond to Mr Perlie Gold

## 2014-11-30 NOTE — Clinical Social Work Note (Signed)
CSW contacted [facility] to see when they would be sending someone to assess pt.  Per [place] at the facility Shanda Bumps would be coming to assess the pt today, hopefully this afternoon.  CSW will continue to follow.

## 2014-11-30 NOTE — Care Management (Addendum)
Patient to discharge today back to [facility] memory care unit.  She has been assessed by facility staff and her needs can be met.  She is going to require new home 02  For low grade chf and pneumonia sx.  Referral to [facility] for 02.  Referral for SN and Physical Therapy to [facility] as this is the facilities home health agency.  REferral called and faxed to [facility].  Requested SN visit within 24 hours of discharge.

## 2014-11-30 NOTE — Discharge Summary (Signed)
[facility] NAME: Alicia Cohen    MR#:  [id number]  DATE OF BIRTH:  06-19-1930  DATE OF ADMISSION:  11/25/2014 ADMITTING PHYSICIAN: Harrie Foreman, MD  DATE OF DISCHARGE: 11/30/14  PRIMARY CARE PHYSICIAN: Maryland Pink, MD    ADMISSION DIAGNOSIS:  NSTEMI (non-ST elevated myocardial infarction) [I21.4] Atrial fibrillation with rapid ventricular response [I48.91]  DISCHARGE DIAGNOSIS:  New onset Afibrillation Mild CHF acute with hypoxia requiring oxygen Pneumonia Advance Dementia  SECONDARY DIAGNOSIS:   Past Medical History  Diagnosis Date  . Hyperlipidemia   . Dementia   . Alzheimer disease   . Hypertension   . GERD (gastroesophageal reflux disease)   . Osteoporosis   . Kidney disease, chronic, stage III (moderate, EGFR 30-59 ml/min)   . DNR (do not resuscitate)     HOSPITAL COURSE:    10 y. o female with history of dementia presents to the emergency department via EMS after an unwitnessed fall at her nursing facility. On arrival patient was found to have atrial fibrillation with rapid ventricular rate  #1 atrial fibrillation with RVR:  - High heart rates likely being driven by pneumonia. - no anticoagulation due to fall risk and possibly hemorrhagic right frontal lobe mass Pt asymptomatic -on cardizem and BB  #2 pneumonia with increased work of breathing likley has mild CHF acute  Change to po augmentin -requiring about 2 liter [place] oxygen -trial of lasix for 2 days. Will need oxygen and can be weaned off at [facility] -cxr 7/21 showed possible opacity right UL. Will defer to PCP as out tpt for f/u CT chest or cxr to ensure clearing after completion of abxs  #3 fall with head injury: - appreciate neurology consultation, has signed off - MRI with subacute hematoma possible hemorrhagic mass/contusion - no anticoagulation  #4 chronic kidney disease stage 4: Stable  #5 dementia: Stable. No  behavioral disturbance No sitter is needed.  #6 leucocytosis: - blood cx negatvie, initial UA without infection - CXR with questionable opacity in LLL, recieved treatment for HCAP (from SNF) with vanc, zosyn -will change to po augmentin  #7 clinical malnutrition - suppliment. Speech therapy evaluation today with concern for aspiration.New diet recommendations appreciated.  Prophylaxis heparin for DVT prophylaxis, no GI prophylaxis Overall improving D/c back to [facility] with HHPT  CODE STATUS: DO NOT RESUSCITATE  DISCHARGE CONDITIONS:   fair  CONSULTS OBTAINED:  Treatment Team:  Corey Skains, MD Valora Corporal, MD  DRUG ALLERGIES:  No Known Allergies  DISCHARGE MEDICATIONS:   Current Discharge Medication List    START taking these medications   Details  amoxicillin-clavulanate (AUGMENTIN) 400-57 MG/5ML suspension Take 5 mLs (400 mg total) by mouth every 12 (twelve) hours. For 2 days Qty: 100 mL, Refills: 0    diltiazem (CARDIZEM CD) 240 MG 24 hr capsule Take 1 capsule (240 mg total) by mouth daily. Qty: 30 capsule, Refills: 0    !! metoprolol (LOPRESSOR) 50 MG tablet Take 1 tablet (50 mg total) by mouth 2 (two) times daily. Qty: 30 tablet, Refills: 0     !! - Potential duplicate medications found. Please discuss with provider.    CONTINUE these medications which have NOT CHANGED   Details  guaifenesin (HUMIBID E) 400 MG TABS tablet Take 400 mg by mouth 2 (two) times daily.    lisinopril (PRINIVIL,ZESTRIL) 5 MG tablet Take 5 mg by mouth daily.    loratadine (CLARITIN) 10 MG tablet Take  10 mg by mouth daily.    !! metoprolol tartrate (LOPRESSOR) 25 MG tablet Take 25 mg by mouth 2 (two) times daily.    Multiple Vitamin (MULTIVITAMIN) tablet Take 1 tablet by mouth daily.    omeprazole (PRILOSEC) 40 MG capsule Take 40 mg by mouth daily.    rivastigmine (EXELON) 9.5 mg/24hr Place 9.5 mg onto the skin daily.     !! - Potential duplicate medications found.  Please discuss with provider.    STOP taking these medications     Multiple Vitamin (MULTIVITAMIN) capsule         If you experience worsening of your admission symptoms, develop shortness of breath, life threatening emergency, suicidal or homicidal thoughts you must seek medical attention immediately by calling 911 or calling your MD immediately  if symptoms less severe.  You Must read complete instructions/literature along with all the possible adverse reactions/side effects for all the Medicines you take and that have been prescribed to you. Take any new Medicines after you have completely understood and accept all the possible adverse reactions/side effects.   Please note  You were cared for by a hospitalist during your hospital stay. If you have any questions about your discharge medications or the care you received while you were in the hospital after you are discharged, you can call the unit and asked to speak with the hospitalist on call if the hospitalist that took care of you is not available. Once you are discharged, your primary care physician will handle any further medical issues. Please note that NO REFILLS for any discharge medications will be authorized once you are discharged, as it is imperative that you return to your primary care physician (or establish a relationship with a primary care physician if you do not have one) for your aftercare needs so that they can reassess your need for medications and monitor your lab values. Today   SUBJECTIVE   No complaints   VITAL SIGNS:  Blood pressure 159/82, pulse 108, temperature 98.4 F (36.9 C), temperature source Oral, resp. rate 20, height '5\' 2"'  (1.575 m), weight 43.772 kg (96 lb 8 oz), SpO2 88 %.  I/O:   Intake/Output Summary (Last 24 hours) at 11/30/14 1239 Last data filed at 11/30/14 1053  Gross per 24 hour  Intake    840 ml  Output    200 ml  Net    640 ml    PHYSICAL EXAMINATION:  GENERAL:  81 y.o.-year-old  patient lying in the bed with no acute distress. thin EYES: Pupils equal, round, reactive to light and accommodation. No scleral icterus. Extraocular muscles intact.  HEENT: Head atraumatic, normocephalic. Oropharynx and nasopharynx clear.  NECK:  Supple, no jugular venous distention. No thyroid enlargement, no tenderness.  LUNGS:distant breath sounds bilaterally, no wheezing, rales,rhonchi or crepitation. No use of accessory muscles of respiration.  CARDIOVASCULAR: S1, S2 normal. No murmurs, rubs, or gallops.  ABDOMEN: Soft, non-tender, non-distended. Bowel sounds present. No organomegaly or mass.  EXTREMITIES: No pedal edema, cyanosis, or clubbing.  NEUROLOGIC: unable to assess but grossly nonfocal. . Gait not checked.  PSYCHIATRIC:  patient is alert.severe dementia  SKIN: No obvious rash, lesion, or ulcer.   DATA REVIEW:   CBC   Recent Labs Lab 11/27/14 0434  WBC 19.8*  HGB 10.6*  HCT 32.6*  PLT 222    Chemistries   Recent Labs Lab 11/25/14 0220  11/28/14 0451  NA 133*  < > 138  K 3.7  < > 3.1*  CL 96*  < > 107  CO2 27  < > 23  GLUCOSE 120*  < > 112*  BUN 30*  < > 24*  CREATININE 1.51*  < > 1.50*  CALCIUM 8.7*  < > 8.5*  AST 27  --   --   ALT 21  --   --   ALKPHOS 115  --   --   BILITOT 0.4  --   --   < > = values in this interval not displayed.  Microbiology Results   Recent Results (from the past 240 hour(s))  MRSA PCR Screening     Status: None   Collection Time: 11/25/14  1:55 AM  Result Value Ref Range Status   MRSA by PCR NEGATIVE NEGATIVE Final    Comment:        The GeneXpert MRSA Assay (FDA approved for NASAL specimens only), is one component of a comprehensive MRSA colonization surveillance program. It is not intended to diagnose MRSA infection nor to guide or monitor treatment for MRSA infections.   Culture, blood (routine x 2)     Status: None (Preliminary result)   Collection Time: 11/26/14  1:06 PM  Result Value Ref Range Status    Specimen Description BLOOD RIGHT ASSIST CONTROL  Final   Special Requests BOTTLES DRAWN AEROBIC AND ANAEROBIC 5CC  Final   Culture NO GROWTH 4 DAYS  Final   Report Status PENDING  Incomplete  Culture, blood (routine x 2)     Status: None (Preliminary result)   Collection Time: 11/26/14  1:23 PM  Result Value Ref Range Status   Specimen Description BLOOD RIGHT ARM  Final   Special Requests BOTTLES DRAWN AEROBIC AND ANAEROBIC 5CC  Final   Culture NO GROWTH 4 DAYS  Final   Report Status PENDING  Incomplete    RADIOLOGY:  No results found.   Management plans discussed with the patient, family and they are in agreement.  CODE STATUS:     Code Status Orders        Start     Ordered   11/26/14 1002  Do not attempt resuscitation (DNR)   Continuous    Question Answer Comment  In the event of cardiac or respiratory ARREST Do not call a "code blue"   In the event of cardiac or respiratory ARREST Do not perform Intubation, CPR, defibrillation or ACLS   In the event of cardiac or respiratory ARREST Use medication by any route, position, wound care, and other measures to relive pain and suffering. May use oxygen, suction and manual treatment of airway obstruction as needed for comfort.      11/26/14 1002    Advance Directive Documentation        Most Recent Value   Type of Advance Directive  Out of facility DNR (pink MOST or yellow form) [paper copy in chart, confirmed with son]   Pre-existing out of facility DNR order (yellow form or pink MOST form)  Yellow form placed in chart (order not valid for inpatient use), Physician notified to receive inpatient order   "MOST" Form in Place?        TOTAL TIME TAKING CARE OF THIS PATIENT: 40 minutes.    Kenslei Hearty M.D on 11/30/2014 at 12:39 PM  Between 7am to 6pm - Pager - [phone] After 6pm go to [email or web address] - password [email or web address] [facility]  [facility]  Office  [phone]  CC: Primary care physician; Maryland Pink,  MD

## 2014-11-30 NOTE — Clinical Social Work Note (Signed)
Notified pt's husband [phone], [place], and RN that pt would DC back to [facility] today via EMS.

## 2014-11-30 NOTE — Progress Notes (Addendum)
SATURATION QUALIFICATIONS: (This note is used to comply with regulatory documentation for home oxygen)  Patient Saturations on Room Air at Rest 88%  Patient saturation at rest on 2L 92 %.

## 2014-11-30 NOTE — Progress Notes (Addendum)
Patient transfer to [facility] facility via EMS at this time,. I was unable to find patient eye glasses in the room,  according to pt significant member at bedside she had a pair of eye glasses on the day of admission but i could nt find any glasses in the room.  Attempted to notified Clemmie Krill that i didn't see any glasses in the room but no one answer the phone.

## 2014-11-30 NOTE — Progress Notes (Signed)
PT Cancellation Note  Patient Details Name: Alicia Cohen MRN: [id number] DOB: Nov 11, 1930   Cancelled Treatment:    Reason Eval/Treat Not Completed: Other (comment).  Chart reviewed, RN consulted. Treatment attempted, however pt found to have recently soiled self. I made RN/NA aware that pt required immediate attention for person hygiene. Will attempt treatment at later date and time.    Buccola,Allan C 11/30/2014, 11:52 AM  11:54 AM  Rosamaria Lints, PT, DPT [place] License # [id number]

## 2014-11-30 NOTE — Care Management Important Message (Signed)
Important Message  Patient Details  Name: Alicia Cohen MRN: [id number] Date of Birth: [birth year]   Medicare Important Message Given:  Yes-third notification given    Olegario Messier A Allmond 11/30/2014, 11:53 AM

## 2014-11-30 NOTE — Discharge Instructions (Signed)
Oxygen 2 liters/min and wean to off if sats remian greater than 92% on RA

## 2014-12-01 LAB — CULTURE, BLOOD (ROUTINE X 2)
Culture: NO GROWTH
Culture: NO GROWTH

## 2014-12-11 ENCOUNTER — Encounter: Payer: Self-pay | Admitting: Emergency Medicine

## 2014-12-11 ENCOUNTER — Emergency Department
Admission: EM | Admit: 2014-12-11 | Discharge: 2014-12-12 | Disposition: A | Discharge status: Home / Self Care | Payer: Medicare Other | Attending: Emergency Medicine | Admitting: Emergency Medicine

## 2014-12-11 DIAGNOSIS — F0280 Dementia in other diseases classified elsewhere without behavioral disturbance: Secondary | ICD-10-CM | POA: Diagnosis not present

## 2014-12-11 DIAGNOSIS — G309 Alzheimer's disease, unspecified: Secondary | ICD-10-CM | POA: Diagnosis not present

## 2014-12-11 DIAGNOSIS — S0510XA Contusion of eyeball and orbital tissues, unspecified eye, initial encounter: Secondary | ICD-10-CM | POA: Insufficient documentation

## 2014-12-11 DIAGNOSIS — N183 Chronic kidney disease, stage 3 (moderate): Secondary | ICD-10-CM | POA: Diagnosis not present

## 2014-12-11 DIAGNOSIS — Y92128 Other place in nursing home as the place of occurrence of the external cause: Secondary | ICD-10-CM | POA: Diagnosis not present

## 2014-12-11 DIAGNOSIS — I129 Hypertensive chronic kidney disease with stage 1 through stage 4 chronic kidney disease, or unspecified chronic kidney disease: Secondary | ICD-10-CM | POA: Diagnosis not present

## 2014-12-11 DIAGNOSIS — Y9389 Activity, other specified: Secondary | ICD-10-CM | POA: Diagnosis not present

## 2014-12-11 DIAGNOSIS — I82412 Acute embolism and thrombosis of left femoral vein: Secondary | ICD-10-CM | POA: Diagnosis not present

## 2014-12-11 DIAGNOSIS — X58XXXA Exposure to other specified factors, initial encounter: Secondary | ICD-10-CM | POA: Diagnosis not present

## 2014-12-11 DIAGNOSIS — Y998 Other external cause status: Secondary | ICD-10-CM | POA: Diagnosis not present

## 2014-12-11 DIAGNOSIS — I82402 Acute embolism and thrombosis of unspecified deep veins of left lower extremity: Secondary | ICD-10-CM

## 2014-12-11 DIAGNOSIS — I499 Cardiac arrhythmia, unspecified: Secondary | ICD-10-CM | POA: Insufficient documentation

## 2014-12-11 NOTE — ED Provider Notes (Addendum)
[facility] Emergency Department Provider Note     Time seen: ----------------------------------------- 2:51 PM on 12/11/2014 -----------------------------------------    I have reviewed the triage vital signs and the nursing notes. L5 caveat: Review of systems and history is difficult to obtain due to dementia  HISTORY  Chief Complaint DVT    HPI Alicia Cohen is a 81 y.o. female who presents ER having had an outpatient ultrasound that reveals a DVT in the common femoral vein. She is from the nursing home, is demented, DO NOT RESUSCITATE paperwork arrives with the patient. She denies any complaints currently   Past Medical History  Diagnosis Date  . Hyperlipidemia   . Dementia   . Alzheimer disease   . Hypertension   . GERD (gastroesophageal reflux disease)   . Osteoporosis   . Kidney disease, chronic, stage III (moderate, EGFR 30-59 ml/min)   . DNR (do not resuscitate)     Patient Active Problem List   Diagnosis Date Noted  . Atrial fibrillation with RVR 11/25/2014    Past Surgical History  Procedure Laterality Date  . Hip fracture surgery      Allergies Review of patient's allergies indicates no known allergies.  Social History History  Substance Use Topics  . Smoking status: Never Smoker   . Smokeless tobacco: Not on file  . Alcohol Use: No    Review of Systems Patient denies any complaints  Positive for reported left leg swelling  ____________________________________________   PHYSICAL EXAM:  VITAL SIGNS: ED Triage Vitals  Enc Vitals Group     BP 12/11/14 1442 161/60 mmHg     Pulse --      Resp 12/11/14 1442 18     Temp 12/11/14 1442 97.7 F (36.5 C)     Temp Source 12/11/14 1442 Oral     SpO2 --      Weight --      Height --      Head Cir --      Peak Flow --      Pain Score --      Pain Loc --      Pain Edu? --      Excl. in [place]? --     Constitutional: Alert , disoriented. Well appearing and in no  distress. Eyes: Conjunctivae are normal. PERRL. Normal extraocular movements. ENT   Head: Left periorbital ecchymosis   Nose: No congestion/rhinnorhea.   Mouth/Throat: Mucous membranes are moist.   Neck: No stridor. Cardiovascular: Rapid rate, irregular rhythm. Normal and symmetric distal pulses are present in all extremities. No murmurs, rubs, or gallops. Respiratory: Normal respiratory effort without tachypnea nor retractions. Breath sounds are clear and equal bilaterally. No wheezes/rales/rhonchi. Gastrointestinal: Soft and nontender. No distention.  Musculoskeletal: Markedly edema is noted in the left lower extremity, it is nontender to touch. Neurologic:  Normal speech and language. Left-sided facial droop Skin:  Skin is warm, dry and intact. No rash noted. Facial ecchymosis Psychiatric: Mood and affect are normal.  ____________________________________________  EKG: Interpreted by me. Sinus tachycardia with a rate of 111 bpm, normal axis,, no evidence of hypertrophy or acute infarction.  ____________________________________________  ED COURSE:  Pertinent labs & imaging results that were available during my care of the patient were reviewed by me and considered in my medical decision making (see chart for details). Unsure if patient is a candidate for anticoagulation given her recent hemorrhagic contusion in the frontal lobe. ____________________________________________    FINAL ASSESSMENT AND PLAN  DVT  Plan: Patient with labs and imaging as dictated above. Patient is not a candidate for anticoagulation given intracranial bleeding as recently as 2 weeks ago. Discussed with vascular surgeon, she may be a candidate if desired for a IVC filter however this can be arranged as an outpatient. Patient denies complaints currently. Family agrees with plan to not treat at this time.   Earleen Newport, MD   Earleen Newport, MD 12/11/14 [place], MD 12/11/14 [phone]

## 2014-12-11 NOTE — ED Notes (Signed)
Chart given to ED secretary who will arrange transport

## 2014-12-11 NOTE — ED Notes (Signed)
Waiting for d/c paper work so pt can be transported

## 2014-12-11 NOTE — Discharge Instructions (Signed)
Deep Vein Thrombosis °A deep vein thrombosis (DVT) is a blood clot that develops in the deep, larger veins of the leg, arm, or pelvis. These are more dangerous than clots that might form in veins near the surface of the body. A DVT can lead to serious and even life-threatening complications if the clot breaks off and travels in the bloodstream to the lungs.  °A DVT can damage the valves in your leg veins so that instead of flowing upward, the blood pools in the lower leg. This is called post-thrombotic syndrome, and it can result in pain, swelling, discoloration, and sores on the leg. °CAUSES °Usually, several things contribute to the formation of blood clots. Contributing factors include: °· The flow of blood slows down. °· The inside of the vein is damaged in some way. °· You have a condition that makes blood clot more easily. °RISK FACTORS °Some people are more likely than others to develop blood clots. Risk factors include:  °· Smoking. °· Being overweight (obese). °· Sitting or lying still for a long time. This includes long-distance travel, paralysis, or recovery from an illness or surgery. °Other factors that increase risk are:  °· Older age, especially over 75 years of age. °· Having a family history of blood clots or if you have already had a blot clot. °· Having major or lengthy surgery. This is especially true for surgery on the hip, knee, or belly (abdomen). Hip surgery is particularly high risk. °· Having a long, thin tube (catheter) placed inside a vein during a medical procedure. °· Breaking a hip or leg. °· Having cancer or cancer treatment. °· Pregnancy and childbirth. °¨ Hormone changes make the blood clot more easily during pregnancy. °¨ The fetus puts pressure on the veins of the pelvis. °¨ There is a risk of injury to veins during delivery or a caesarean delivery. The risk is highest just after childbirth. °· Medicines containing the female hormone estrogen. This includes birth control pills and  hormone replacement therapy. °· Other circulation or heart problems. ° °SIGNS AND SYMPTOMS °When a clot forms, it can either partially or totally block the blood flow in that vein. Symptoms of a DVT can include: °· Swelling of the leg or arm, especially if one side is much worse. °· Warmth and redness of the leg or arm, especially if one side is much worse. °· Pain in an arm or leg. If the clot is in the leg, symptoms may be more noticeable or worse when standing or walking. °The symptoms of a DVT that has traveled to the lungs (pulmonary embolism, PE) usually start suddenly and include: °· Shortness of breath. °· Coughing. °· Coughing up blood or blood-tinged mucus. °· Chest pain. The chest pain is often worse with deep breaths. °· Rapid heartbeat. °Anyone with these symptoms should get emergency medical treatment right away. Do not wait to see if the symptoms will go away. Call your local emergency services (911 in the [place] if you have these symptoms. Do not drive yourself to the hospital. °DIAGNOSIS °If a DVT is suspected, your health care provider will take a full medical history and perform a physical exam. Tests that also may be required include: °· Blood tests, including studies of the clotting properties of the blood. °· Ultrasound to see if you have clots in your legs or lungs. °· X-rays to show the flow of blood when dye is injected into the veins (venogram). °· Studies of your lungs if you have any   chest symptoms. °PREVENTION °· Exercise the legs regularly. Take a brisk 30-minute walk every day. °· Maintain a weight that is appropriate for your height. °· Avoid sitting or lying in bed for long periods of time without moving your legs. °· Women, particularly those over the age of 35 years, should consider the risks and benefits of taking estrogen medicines, including birth control pills. °· Do not smoke, especially if you take estrogen medicines. °· Long-distance travel can increase your risk of DVT. You  should exercise your legs by walking or pumping the muscles every hour. °· Many of the risk factors above relate to situations that exist with hospitalization, either for illness, injury, or elective surgery. Prevention may include medical and nonmedical measures. °¨ Your health care provider will assess you for the need for venous thromboembolism prevention when you are admitted to the hospital. If you are having surgery, your surgeon will assess you the day of or day after surgery. °TREATMENT °Once identified, a DVT can be treated. It can also be prevented in some circumstances. Once you have had a DVT, you may be at increased risk for a DVT in the future. The most common treatment for DVT is blood-thinning (anticoagulant) medicine, which reduces the blood's tendency to clot. Anticoagulants can stop new blood clots from forming and stop old clots from growing. They cannot dissolve existing clots. Your body does this by itself over time. Anticoagulants can be given by mouth, through an IV tube, or by injection. Your health care provider will determine the best program for you. Other medicines or treatments that may be used are: °· Heparin or related medicines (low molecular weight heparin) are often the first treatment for a blood clot. They act quickly. However, they cannot be taken orally and must be given either in shot form or by IV tube. °¨ Heparin can cause a fall in a component of blood that stops bleeding and forms blood clots (platelets). You will be monitored with blood tests to be sure this does not occur. °· Warfarin is an anticoagulant that can be swallowed. It takes a few days to start working, so usually heparin or related medicines are used in combination. Once warfarin is working, heparin is usually stopped. °· Factor Xa inhibitor medicines, such as rivaroxaban and apixaban, also reduce blood clotting. These medicines are taken orally and can often be used without heparin or related  medicines. °· Less commonly, clot dissolving drugs (thrombolytics) are used to dissolve a DVT. They carry a high risk of bleeding, so they are used mainly in severe cases where your life or a part of your body is threatened. °· Very rarely, a blood clot in the leg needs to be removed surgically. °· If you are unable to take anticoagulants, your health care provider may arrange for you to have a filter placed in a main vein in your abdomen. This filter prevents clots from traveling to your lungs. °HOME CARE INSTRUCTIONS °· Take all medicines as directed by your health care provider. °· Learn as much as you can about DVT. °· Wear a medical alert bracelet or carry a medical alert card. °· Ask your health care provider how soon you can go back to normal activities. It is important to stay active to prevent blood clots. If you are on anticoagulant medicine, avoid contact sports. °· It is very important to exercise. This is especially important while traveling, sitting, or standing for long periods of time. Exercise your legs by walking or by   tightening and relaxing your leg muscles regularly. Take frequent walks. °· You may need to wear compression stockings. These are tight elastic stockings that apply pressure to the lower legs. This pressure can help keep the blood in the legs from clotting. °Taking Warfarin °Warfarin is a daily medicine that is taken by mouth. Your health care provider will advise you on the length of treatment (usually 3-6 months, sometimes lifelong). If you take warfarin: °· Understand how to take warfarin and foods that can affect how warfarin works in your body. °· Too much and too little warfarin are both dangerous. Too much warfarin increases the risk of bleeding. Too little warfarin continues to allow the risk for blood clots. °Warfarin and Regular Blood Testing °While taking warfarin, you will need to have regular blood tests to measure your blood clotting time. These blood tests usually  include both the prothrombin time (PT) and international normalized ratio (INR) tests. The PT and INR results allow your health care provider to adjust your dose of warfarin. It is very important that you have your PT and INR tested as often as directed by your health care provider.    °Warfarin and Your Diet °Avoid major changes in your diet, or notify your health care provider before changing your diet. Arrange a visit with a registered dietitian to answer your questions. Many foods, especially foods high in vitamin K, can interfere with warfarin and affect the PT and INR results. You should eat a consistent amount of foods high in vitamin K. Foods high in vitamin K include:  °· Spinach, kale, broccoli, cabbage, collard and turnip greens, Brussels sprouts, peas, cauliflower, seaweed, and parsley. °· Beef and pork liver. °· Green tea. °· Soybean oil. °Warfarin with Other Medicines °Many medicines can interfere with warfarin and affect the PT and INR results. You must: °· Tell your health care provider about any and all medicines, vitamins, and supplements you take, including aspirin and other over-the-counter anti-inflammatory medicines. Be especially cautious with aspirin and anti-inflammatory medicines. Ask your health care provider before taking these. °· Do not take or discontinue any prescribed or over-the-counter medicine except on the advice of your health care provider or pharmacist. °Warfarin Side Effects °Warfarin can have side effects, such as easy bruising and difficulty stopping bleeding. Ask your health care provider or pharmacist about other side effects of warfarin. You will need to: °· Hold pressure over cuts for longer than usual. °· Notify your dentist and other health care providers that you are taking warfarin before you undergo any procedures where bleeding may occur. °Warfarin with Alcohol and Tobacco  °· Drinking alcohol frequently can increase the effect of warfarin, leading to excess  bleeding. It is best to avoid alcoholic drinks or to consume only very small amounts while taking warfarin. Notify your health care provider if you change your alcohol intake.   °· Do not use any tobacco products including cigarettes, chewing tobacco, or electronic cigarettes. If you smoke, quit. Ask your health care provider for help with quitting smoking. °Alternative Medicines to Warfarin: Factor Xa Inhibitor Medicines °· These blood-thinning medicines are taken by mouth, usually for several weeks or longer. It is important to take the medicine every single day at the same time each day. °· There are no regular blood tests required when using these medicines. °· There are fewer food and drug interactions than with warfarin. °· The side effects of this class of medicine are similar to those of warfarin, including excessive bruising or bleeding. Ask your   health care provider or pharmacist about other potential side effects. SEEK MEDICAL CARE IF:  You notice a rapid heartbeat.  You feel weaker or more tired than usual.  You feel faint.  You notice increased bruising.  You feel your symptoms are not getting better in the time expected.  You believe you are having side effects of medicine. SEEK IMMEDIATE MEDICAL CARE IF:  You have chest pain.  You have trouble breathing.  You have new or increased swelling or pain in one leg.  You cough up blood.  You notice blood in vomit, in a bowel movement, or in urine. MAKE SURE YOU:  Understand these instructions.  Will watch your condition.  Will get help right away if you are not doing well or get worse. Document Released: 04/24/2005 Document Revised: 09/08/2013 Document Reviewed: 12/30/2012 [facility] Patient Information 2015 [place], [place]. This information is not intended to replace advice given to you by your health care provider. Make sure you discuss any questions you have with your health care provider.  DR Mayford Knife DISCUSSED PATIENT  WITH HER SON. PATIENT WILL BE RETURNED TO CARE HOME.NO FURTHER TREATMENT FOR THIS PROBLEM AT THIS TIME.

## 2014-12-11 NOTE — ED Notes (Signed)
Pt comes in via EMS c/o a blood clot that was confirmed this morning in her left leg.  VS stable.  Patient denies pain.

## 2014-12-11 NOTE — ED Notes (Addendum)
Called and talked with Wende Mott, Med Tech at [facility].  She was given report on the patient.  She states that they do not have transportation for the patient at this time and we should send the patient via EMS.

## 2014-12-11 NOTE — ED Notes (Signed)
Pt resting on stretcher with eyes closed,  NAD noted.  She is still waiting for EMS to transport back to [facility] assisted living

## 2015-10-07 DEATH — deceased

## 2017-05-15 IMAGING — MR MR HEAD W/O CM
10 series · 48 of 48 positions shown · non-contrast
Comparison: Head CT from yesterday

CLINICAL DATA: Dementia.  Brain mass.

EXAM:
MRI HEAD WITHOUT CONTRAST
TECHNIQUE: Multiplanar, multiecho pulse sequences of the brain and surrounding
structures were obtained without intravenous contrast.

[Series 2: GRE · sagittal · 5.0mm · 0.45mm/px · 4 of 23 slices shown (1 of 2)]
[im 1/23]
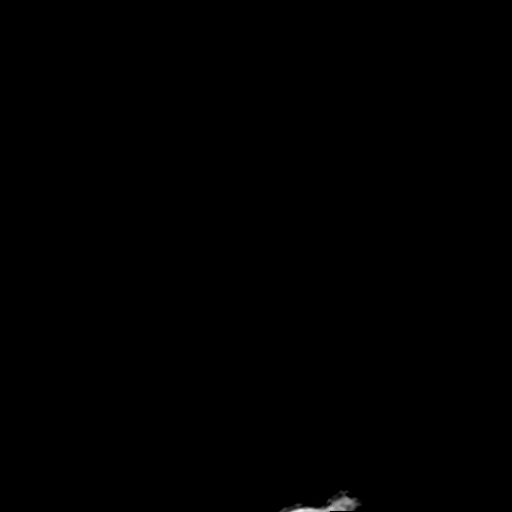
[im 8/23]
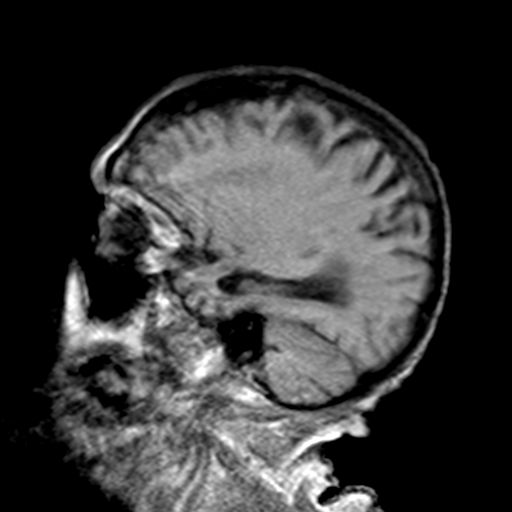
[im 15/23]
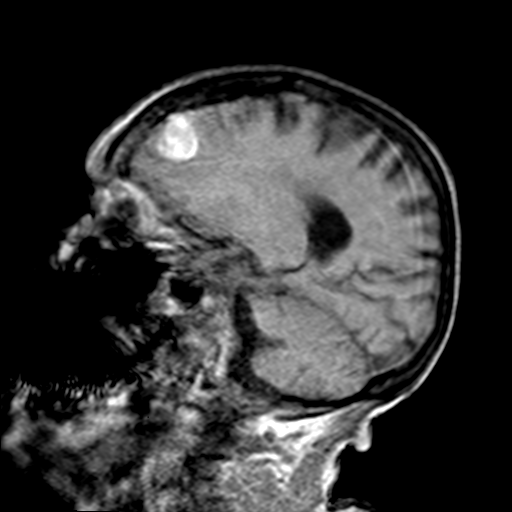
[im 23/23]
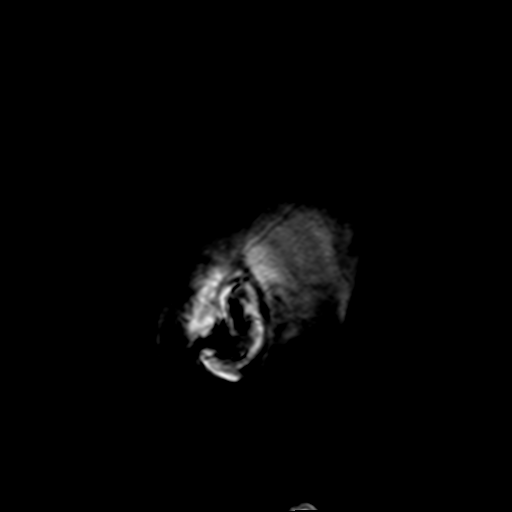

[Series 7: T2 · axial · 5.0mm · 0.45mm/px · z∈[-11,+121]mm · 3 of 24 slices shown (1 of 3)]
[im 1/24]
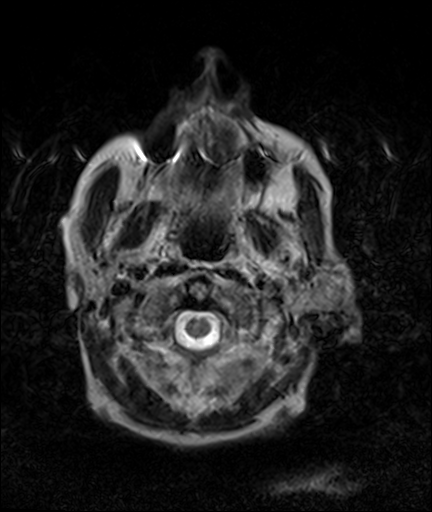
[im 12/24]
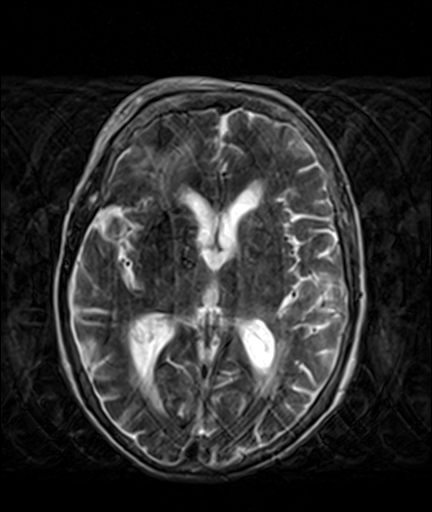
[im 24/24]
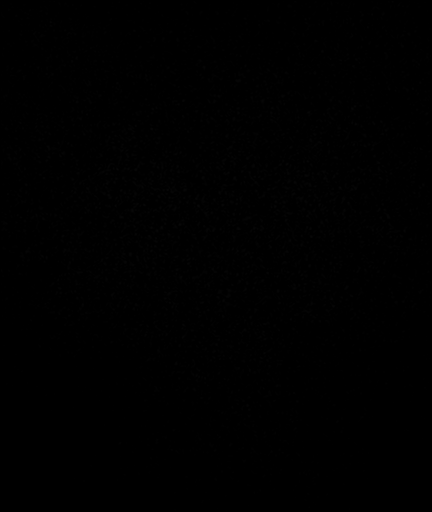

[Series 8: FLAIR · axial · 5.0mm · 0.45mm/px · z∈[-11,+121]mm · 3 of 25 slices shown]
[im 1/25]
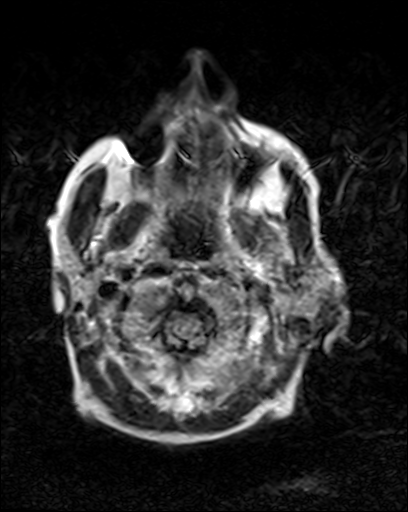
[im 13/25]
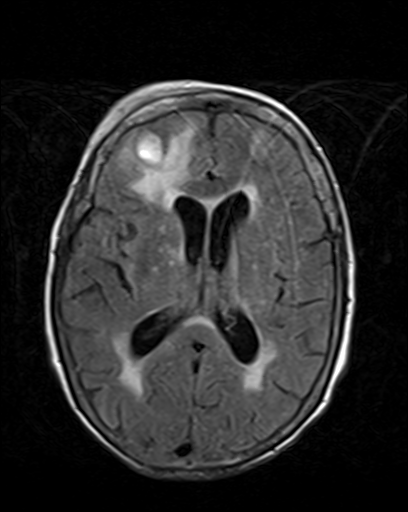
[im 25/25]
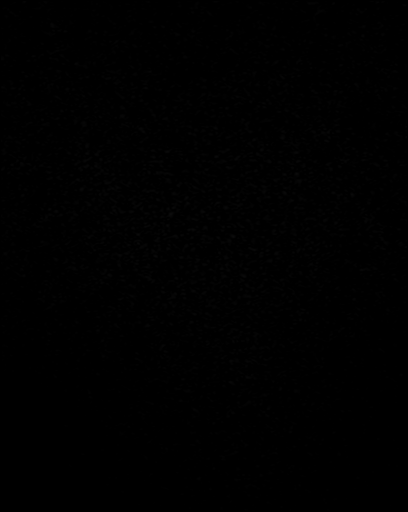

[Series 9: T2 · axial · 5.0mm · 1.20mm/px · z∈[-10,+122]mm · 3 of 25 slices shown (2 of 3)]
[im 1/25]
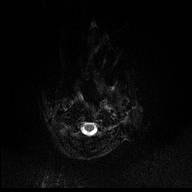
[im 13/25]
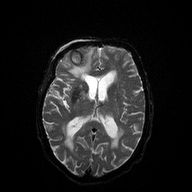
[im 25/25]
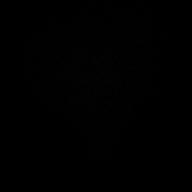

[Series 10: GRE · axial · 5.0mm · 0.45mm/px · z∈[-10,+122]mm · 3 of 25 slices shown (2 of 2)]
[im 1/25]
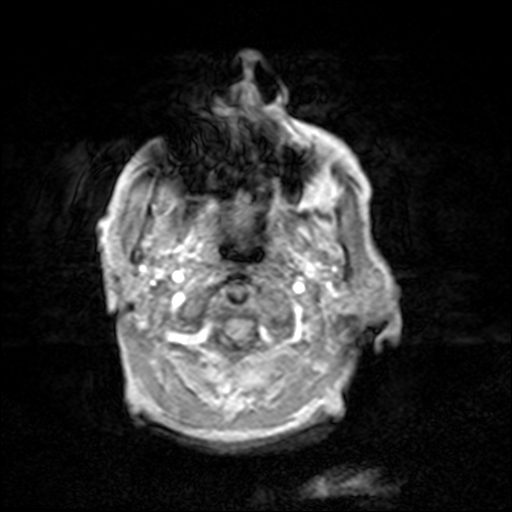
[im 13/25]
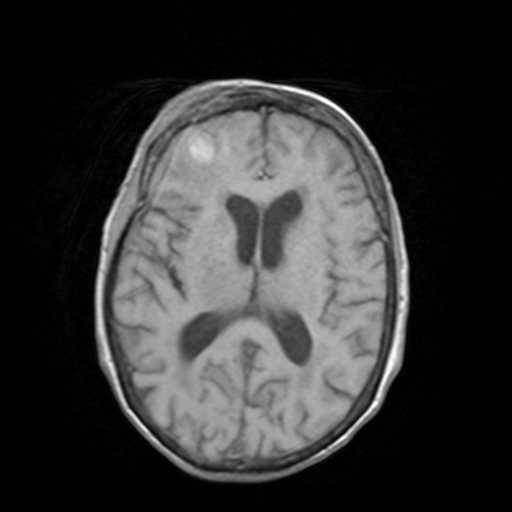
[im 25/25]
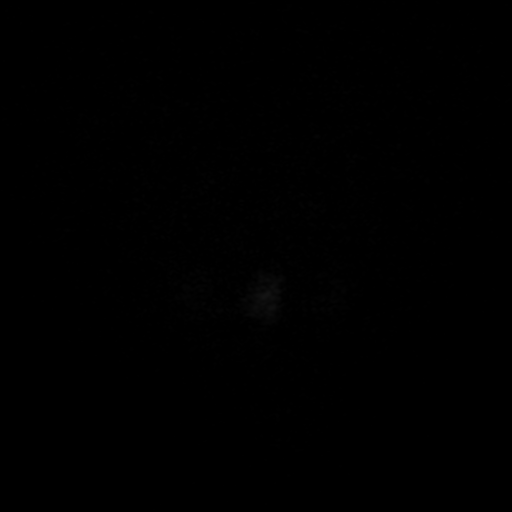

[Series 11: T2 · coronal · 5.0mm · 0.45mm/px · 4 of 28 slices shown (3 of 3)]
[im 1/28]
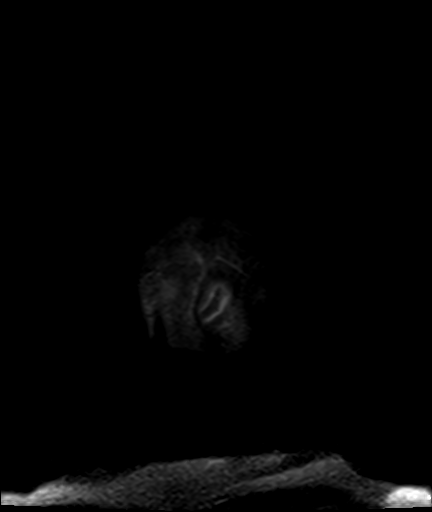
[im 10/28]
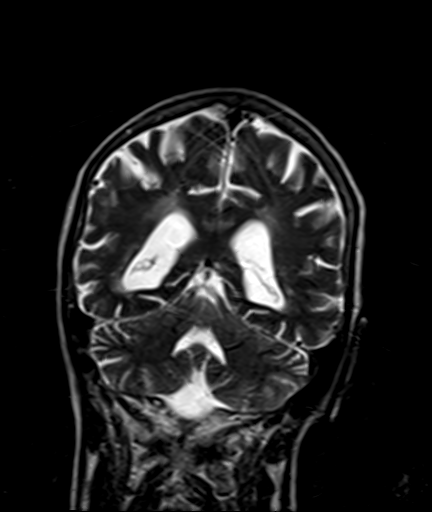
[im 19/28]
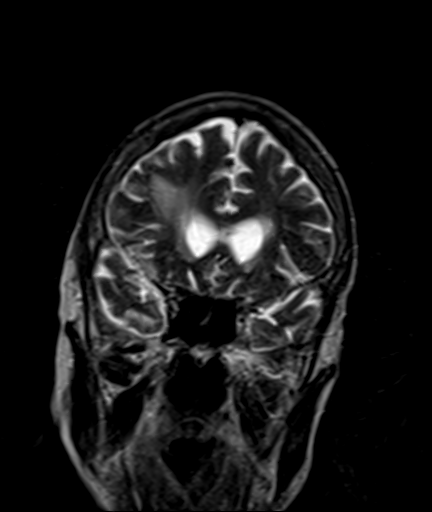
[im 28/28]
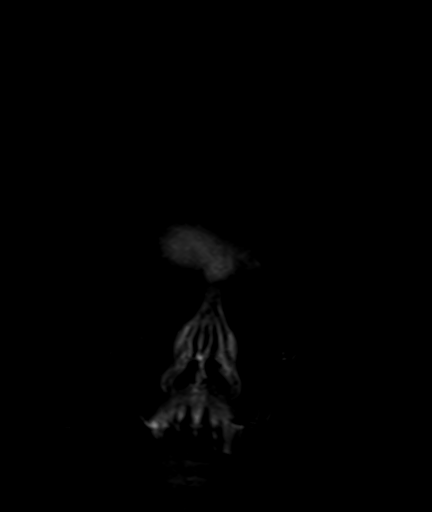

[Series 100: DWI · axial · 3.0mm · 1.80mm/px · z∈[-7,+118]mm · 6 of 50 slices shown (1 of 2)]
[im 1/50]
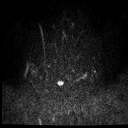
[im 10/50]
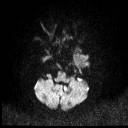
[im 20/50]
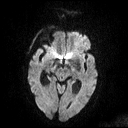
[im 30/50]
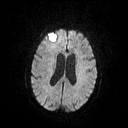
[im 40/50]
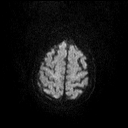
[im 50/50]
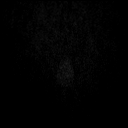

[Series 101: ADC · axial · 3.0mm · 1.80mm/px · z∈[-7,+118]mm · 6 of 48 slices shown (1 of 2)]
[im 1/48]
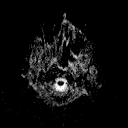
[im 10/48]
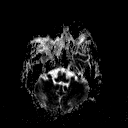
[im 19/48]
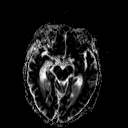
[im 29/48]
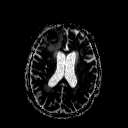
[im 38/48]
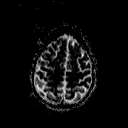
[im 48/48]
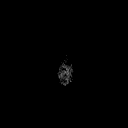

[Series 102: DWI · coronal · 3.0mm · 1.80mm/px · 8 of 62 slices shown (2 of 2)]
[im 1/62]
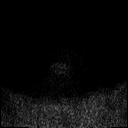
[im 9/62]
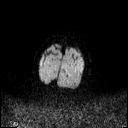
[im 18/62]
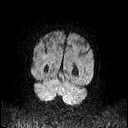
[im 27/62]
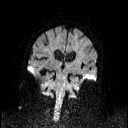
[im 35/62]
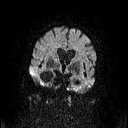
[im 44/62]
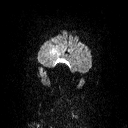
[im 53/62]
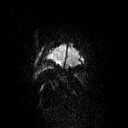
[im 62/62]
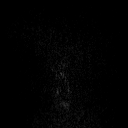

[Series 103: ADC · coronal · 3.0mm · 1.80mm/px · 8 of 62 slices shown (2 of 2)]
[im 1/62]
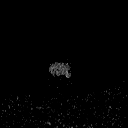
[im 9/62]
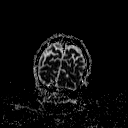
[im 18/62]
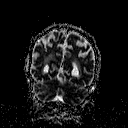
[im 27/62]
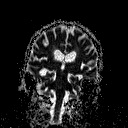
[im 35/62]
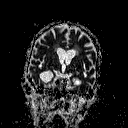
[im 44/62]
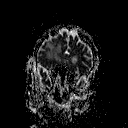
[im 53/62]
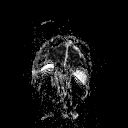
[im 62/62]
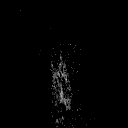

[48 of 48 positions shown; findings below may reference images not displayed]

FINDINGS: Motion degraded study

Calvarium and upper cervical spine: Right frontal scalp swelling. No
focal marrow signal abnormality.

Orbits: No significant findings.

Sinuses and Mastoids: Clear. Mastoid and middle ears are clear.

Brain:

The right frontal mass is again noted, peripheral and in the
anterior aspect of the frontal lobe. Centrally the mass is T2 and T1
hyperintense with a T2 hypo intense rim, likely hemosiderin.
Anteriorly, the cavity appears partially septated. FLAIR and T2
hyperintensity surrounds the presumed cavity; no discrete
neighboring mass lesion. Remote intracranial hemorrhage has been
present superficially in the bilateral parietal lobes, but no overt
amyloid angiopathy. Cannot evaluate for enhancement in this patient
with renal insufficiency and clinical decision not to administer
contrast.

Chronic small-vessel disease is moderate with ischemic gliosis
around the lateral ventricles predominantly.

No acute infarct. No evidence of acute hemorrhage, hydrocephalus, or
major vessel occlusion.

Brain atrophy with prominent mesial temporal involvement,
correlating with history of Alzheimer's disease.
IMPRESSION: 1. The right frontal mass is consistent with subacute hematoma
measuring 21 mm. This could be from recent trauma, amyloid, or
hemorrhagic mass. Since intravenous contrast could not be
administered, recommend serial imaging (with first scan in
approximately 6 to 8 weeks).
2. Brain atrophy with pattern typical of Alzheimer's disease.
